# Patient Record
Sex: Female | Born: 1990 | Race: White | Hispanic: Yes | Marital: Married | Smoking: Never smoker
Health system: Southern US, Community
[De-identification: ages and names within clinical notes are randomized; demographics above are authoritative.]

---
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
## 2009-07-09 ENCOUNTER — Emergency Department (HOSPITAL_COMMUNITY): Admission: EM | Admit: 2009-07-09 | Discharge: 2009-07-10 | Payer: Self-pay | Admitting: Emergency Medicine

## 2010-08-10 LAB — URINALYSIS, ROUTINE W REFLEX MICROSCOPIC
Bilirubin Urine: NEGATIVE
Glucose, UA: NEGATIVE mg/dL
Hgb urine dipstick: NEGATIVE
Ketones, ur: 15 mg/dL — AB
Nitrite: NEGATIVE
Protein, ur: 30 mg/dL — AB
Specific Gravity, Urine: 1.031 — ABNORMAL HIGH (ref 1.005–1.030)
Urobilinogen, UA: 1 mg/dL (ref 0.0–1.0)
pH: 7.5 (ref 5.0–8.0)

## 2010-08-10 LAB — URINE MICROSCOPIC-ADD ON

## 2010-08-10 LAB — WET PREP, GENITAL: Yeast Wet Prep HPF POC: NONE SEEN

## 2010-08-10 LAB — POCT PREGNANCY, URINE: Preg Test, Ur: NEGATIVE

## 2010-08-10 LAB — GC/CHLAMYDIA PROBE AMP, GENITAL
Chlamydia, DNA Probe: NEGATIVE
GC Probe Amp, Genital: NEGATIVE

## 2010-08-29 LAB — CBC
HCT: 41.7 % (ref 36.0–46.0)
Hemoglobin: 14.2 g/dL (ref 12.0–15.0)
MCHC: 34 g/dL (ref 30.0–36.0)
MCV: 86.6 fL (ref 78.0–100.0)
Platelets: 245 10*3/uL (ref 150–400)
RBC: 4.81 MIL/uL (ref 3.87–5.11)
RDW: 13.1 % (ref 11.5–15.5)
WBC: 6.2 10*3/uL (ref 4.0–10.5)

## 2010-08-29 LAB — HCG, SERUM, QUALITATIVE: Preg, Serum: NEGATIVE

## 2010-10-04 NOTE — Op Note (Signed)
Laurie Reid, Laurie Reid              ACCOUNT NO.:  [id number]   MEDICAL RECORD NO.:  [id number]          PATIENT TYPE:  OIB   LOCATION:  5005                         FACILITY:  [facility]   PHYSICIAN:  Vanita Panda. Magnus Ivan, M.D.DATE OF BIRTH:  1990-11-11   DATE OF PROCEDURE:  10/20/2008  DATE OF DISCHARGE:                               OPERATIVE REPORT   PREOPERATIVE DIAGNOSIS:  Right knee displaced tibial eminence avulsion  fracture.   SURGEON:  Right knee displaced tibial eminence avulsion fracture.   PROCEDURE:  Right knee arthroscopy, debridement and attempted direct  primary repair of the displaced tibial eminence.   SURGEON:  Vanita Panda. Magnus Ivan, MD   ANESTHESIA:  General.   ANTIBIOTICS:  1 g IV Ancef.   BLOOD LOSS:  Minimal.   TOURNIQUET TIME:  2 hours 12 minutes.   INDICATIONS:  Briefly, [name] is a 20 year old who last Thursday  slipped and fell down some steps sustaining injury to her right knee.  She had a large effusion and an x-ray showed tibial eminence fracture  which was a large piece with significant displacement.  It was  recommended she undergo attempted repair of this piece of the idea that  she may need ACL reconstruction down the road.  The risks and benefits  of surgery were explained to her in length and she understood the need  to proceed with surgery.   DESCRIPTION OF PROCEDURE:  After informed consent was obtained, the  appropriate right leg was marked.  She was brought to the operating room  and placed supine on the operating table.  General anesthesia was then  obtained.  A nonsterile tourniquet was placed around her upper right  thigh and her leg was prepped and draped with DuraPrep and sterile  drapes including a sterile stockinette.  With the bed raised and lateral  leg post utilized, a time-out was called to identify as the correct  patient and correct right knee.  An Esmarch was used to wrap out the  knee and the tourniquet was  inflated to 250 mL pressure.  I then made an  anterolateral arthroscopy portal and an arthroscope was inserted.  There  was a large effusion encountered of the knee with a bloody effusion.  Right away, you could see that there was a large tibial eminence  fracture that affected mainly the lateral plateau, but also the medial  plateau in the whole eminence.  There was actually even partial tearing  proximally of the ACL off of its femoral attachment and there were only  mild remnants of the ACL intact.  I then made an anterior medial portal  and it was then used as an arthroscopic shaver to debride under the  bone.  The PCL was intact as well as the medial and lateral meniscus.  Next, I attempted to get the piece anatomically reduced which was quite  difficult because this was a very big piece.  Using the Arthrex tibial  guide for ACL repairs, I was able to make incision along the anterior  medial tibia at the level of tibial tubercle.  I then placed  2 guide  pins through the fracture piece and using #2 FiberWire suture to try to  pull the piece back down.  It did not completely reduced, so I tried to  pass 2 more pins and even make a separate stab incision to place a small  Acutrak type of screw to hold the piece down.  This broke it into some  smaller pieces and I was able to not do this in entirety and the piece  was not anatomically reduced.  I then allowed fluid lavage of the knee  and removed all instrumentation.  I closed the deep tissue on the tibia  wound with 0 Vicryl followed by 2-0 Vicryl on the subcutaneous tissue  and interrupted 3-0 nylon on skin.  All portal sites were also closed  with interrupted 3-0 nylon.  I then drained fluid from the knee and then  infiltrated a mixture of Marcaine and morphine into the knee.  I then  put on a plaster splint with the knee in a fully extended and even  hyperextended position.  The patient was then awakened, extubated, and  taken to the  recovery room in stable condition.  Postoperatively, I will  allow her to weight-bear as tolerated, but I am going to keep her knee  in a hyperextended position to allow this piece of bone to try the heal.  I think the worst case scenario would be an ACL reconstruction down the  road once the knee has healed.      Vanita Panda. Magnus Ivan, M.D.  Electronically Signed     CYB/MEDQ  D:  10/20/2008  T:  10/21/2008  Job:  [id number]

## 2010-10-04 NOTE — Discharge Summary (Signed)
NAMEBRAE, SCHAAFSMA              ACCOUNT NO.:  [id number]   MEDICAL RECORD NO.:  [id number]          PATIENT TYPE:  OIB   LOCATION:  5005                         FACILITY:  [facility]   PHYSICIAN:  Vanita Panda. Magnus Ivan, M.D.DATE OF BIRTH:  1991/05/20   DATE OF ADMISSION:  10/20/2008  DATE OF DISCHARGE:  10/22/2008                               DISCHARGE SUMMARY   ADMITTING DIAGNOSIS:  Right knee tibial eminence fracture.   DISCHARGE DIAGNOSIS:  Right knee tibial eminence fracture.   PROCEDURES:  Right knee arthroscopy with repair of right knee tibial  eminence fracture.   HOSPITAL COURSE:  Briefly, [name] is 20 year old who last week  fell down some stairs when she tripped carrying laundry.  She was seen  in the emergency room and found to have displaced tibial eminence  fracture in her right knee.  It was recommended she undergo arthroscopy  with reduction of this significantly displaced piece.  The risks and  benefits of this were explained to her mother, who stated she understood  the need to proceed with the surgery.   She was taken to the operating room on the day of admission where she  underwent a right knee arthroscopy with reduction of the displaced  piece.  She was then admitted to the Orthopedic floor bed and on  postoperative day #1, started to work with physical therapy in a hinged  knee brace with weightbearing as tolerated and her knee extended.  She  understood the need to keep her knee extended as well.  However, with  physical therapy, she did get lightheaded and was on considerable amount  of pain medicines and it was felt that she would benefit from additional  day of physical therapy before being discharged to home.  By the day of  discharge, she was tolerating oral diet as well as oral pain  medications, was much more comfortable, and was felt safe to discharge  home.   DISPOSITION:  Home.   DISCHARGE INSTRUCTIONS:  While she is at home, she will  continue to keep  her knee straight until further notice.   Followup in the office will be in 2 weeks.   DISCHARGE MEDICATIONS:  1. Enteric-coated aspirin daily.  2. Percocet as needed for pain.  3. Robaxin as needed for muscle relaxant.      Vanita Panda. Magnus Ivan, M.D.  Electronically Signed    CYB/MEDQ  D:  10/22/2008  T:  10/22/2008  Job:  [id number]

## 2011-10-05 ENCOUNTER — Encounter (HOSPITAL_COMMUNITY): Payer: Self-pay | Admitting: *Deleted

## 2011-10-05 ENCOUNTER — Emergency Department (HOSPITAL_COMMUNITY)
Admission: EM | Admit: 2011-10-05 | Discharge: 2011-10-05 | Disposition: A | Discharge status: Home / Self Care | Payer: Self-pay | Attending: Emergency Medicine | Admitting: Emergency Medicine

## 2011-10-05 DIAGNOSIS — R197 Diarrhea, unspecified: Secondary | ICD-10-CM | POA: Insufficient documentation

## 2011-10-05 DIAGNOSIS — K529 Noninfective gastroenteritis and colitis, unspecified: Secondary | ICD-10-CM

## 2011-10-05 DIAGNOSIS — R112 Nausea with vomiting, unspecified: Secondary | ICD-10-CM | POA: Insufficient documentation

## 2011-10-05 DIAGNOSIS — K5289 Other specified noninfective gastroenteritis and colitis: Secondary | ICD-10-CM | POA: Insufficient documentation

## 2011-10-05 DIAGNOSIS — R109 Unspecified abdominal pain: Secondary | ICD-10-CM | POA: Insufficient documentation

## 2011-10-05 LAB — COMPREHENSIVE METABOLIC PANEL
ALT: 16 U/L (ref 0–35)
AST: 17 U/L (ref 0–37)
Albumin: 4.8 g/dL (ref 3.5–5.2)
Alkaline Phosphatase: 57 U/L (ref 39–117)
BUN: 10 mg/dL (ref 6–23)
CO2: 27 mEq/L (ref 19–32)
Calcium: 9.9 mg/dL (ref 8.4–10.5)
Chloride: 101 mEq/L (ref 96–112)
Creatinine, Ser: 0.6 mg/dL (ref 0.50–1.10)
GFR calc Af Amer: >90 mL/min (ref >90)
GFR calc non Af Amer: >90 mL/min (ref >90)
Glucose, Bld: 96 mg/dL (ref 70–99)
Potassium: 4.1 mEq/L (ref 3.5–5.1)
Sodium: 139 mEq/L (ref 135–145)
Total Bilirubin: 0.6 mg/dL (ref 0.3–1.2)
Total Protein: 7.7 g/dL (ref 6.0–8.3)

## 2011-10-05 LAB — DIFFERENTIAL
Basophils Absolute: 0 10*3/uL (ref 0.0–0.1)
Basophils Relative: 0 % (ref 0–1)
Eosinophils Absolute: 0 10*3/uL (ref 0.0–0.7)
Eosinophils Relative: 0 % (ref 0–5)
Lymphocytes Relative: 8 % — ABNORMAL LOW (ref 12–46)
Lymphs Abs: 0.8 10*3/uL (ref 0.7–4.0)
Monocytes Absolute: 0.7 10*3/uL (ref 0.1–1.0)
Monocytes Relative: 7 % (ref 3–12)
Neutro Abs: 8.3 10*3/uL — ABNORMAL HIGH (ref 1.7–7.7)
Neutrophils Relative %: 85 % — ABNORMAL HIGH (ref 43–77)

## 2011-10-05 LAB — URINALYSIS, ROUTINE W REFLEX MICROSCOPIC
Glucose, UA: NEGATIVE mg/dL
Ketones, ur: >80 mg/dL — AB
Nitrite: NEGATIVE
Protein, ur: 30 mg/dL — AB
Specific Gravity, Urine: 1.026 (ref 1.005–1.030)
Urobilinogen, UA: 0.2 mg/dL (ref 0.0–1.0)
pH: 7 (ref 5.0–8.0)

## 2011-10-05 LAB — CBC
HCT: 40 % (ref 36.0–46.0)
Hemoglobin: 14.3 g/dL (ref 12.0–15.0)
MCH: 30.2 pg (ref 26.0–34.0)
MCHC: 35.8 g/dL (ref 30.0–36.0)
MCV: 84.4 fL (ref 78.0–100.0)
Platelets: 229 10*3/uL (ref 150–400)
RBC: 4.74 MIL/uL (ref 3.87–5.11)
RDW: 12.7 % (ref 11.5–15.5)
WBC: 9.8 10*3/uL (ref 4.0–10.5)

## 2011-10-05 LAB — URINE MICROSCOPIC-ADD ON

## 2011-10-05 LAB — PREGNANCY, URINE: Preg Test, Ur: NEGATIVE

## 2011-10-05 MED ORDER — MORPHINE SULFATE 4 MG/ML IJ SOLN
4.0000 mg | Freq: Once | INTRAMUSCULAR | Status: DC
  Filled 2011-10-05: qty 1

## 2011-10-05 MED ORDER — ONDANSETRON HCL 4 MG PO TABS: 4.0000 mg | ORAL_TABLET | Freq: Four times a day (QID) | ORAL | Status: AC

## 2011-10-05 MED ORDER — DIPHENOXYLATE-ATROPINE 2.5-0.025 MG PO TABS: 1.0000 | ORAL_TABLET | Freq: Four times a day (QID) | ORAL | Status: AC | PRN

## 2011-10-05 MED ORDER — ONDANSETRON 4 MG PO TBDP
8.0000 mg | ORAL_TABLET | Freq: Once | ORAL | Status: AC
  Administered 2011-10-05: 8 mg via ORAL
  Filled 2011-10-05: qty 2

## 2011-10-05 NOTE — ED Notes (Signed)
Patient is AOx4 and comfortable with her discharge instructions. 

## 2011-10-05 NOTE — Discharge Instructions (Signed)
Ms Laurie Reid we treated you in the ER tonight with IV fluids, zofran and morphine.  Follow up with your PCP if no improvement or return to the ER for high fever, and uncontrolled nausea and vomiting.  Take the Zofran for nausea and the Lomotil for diarrhea stools.  Start the diet called a Bratt diet as listed below.   B.R.A.T. Diet Your doctor has recommended the B.R.A.T. diet for you or your child until the condition improves. This is often used to help control diarrhea and vomiting symptoms. If you or your child can tolerate clear liquids, you may have:  Bananas.   Rice.   Applesauce.   Toast (and other simple starches such as crackers, potatoes, noodles).  Be sure to avoid dairy products, meats, and fatty foods until symptoms are better. Fruit juices such as apple, grape, and prune juice can make diarrhea worse. Avoid these. Continue this diet for 2 days or as instructed by your caregiver. Document Released: 05/08/2005 Document Revised: 04/27/2011 Document Reviewed: 10/25/2006 [facility] Patient Information 2012 [place], [place].   B.R.A.T. Diet Your doctor has recommended the B.R.A.T. diet for you or your child until the condition improves. This is often used to help control diarrhea and vomiting symptoms. If you or your child can tolerate clear liquids, you may have:  Bananas.   Rice.   Applesauce.   Toast (and other simple starches such as crackers, potatoes, noodles).  Be sure to avoid dairy products, meats, and fatty foods until symptoms are better. Fruit juices such as apple, grape, and prune juice can make diarrhea worse. Avoid these. Continue this diet for 2 days or as instructed by your caregiver. Document Released: 05/08/2005 Document Revised: 04/27/2011 Document Reviewed: 10/25/2006 [facility] Patient Information 2012 [place], [place].

## 2011-10-05 NOTE — ED Provider Notes (Signed)
Medical screening examination/treatment/procedure(s) were performed by non-physician practitioner and as supervising physician I was immediately available for consultation/collaboration.   Forbes Cellar, MD 10/05/11 [phone]

## 2011-10-05 NOTE — ED Provider Notes (Signed)
History     CSN: [id number]  Arrival date & time 10/05/11  0059   First MD Initiated Contact with Patient 10/05/11 0319      Chief Complaint  Patient presents with  . Abdominal Pain    (Consider location/radiation/quality/duration/timing/severity/associated sxs/prior treatment) Patient is a 21 y.o. female presenting with abdominal pain. The history is provided by the patient. No language interpreter was used.  Abdominal Pain The primary symptoms of the illness include abdominal pain, nausea, vomiting and diarrhea. The primary symptoms of the illness do not include fever, shortness of breath, dysuria, vaginal discharge or vaginal bleeding.  The patient states that she believes she is currently not pregnant. The patient has not had a change in bowel habit.  Nausea vomiting and diarrhea today since awakening.  States that her daughter had the same thing a couple weeks ago.  She had diarrhea x 7 and vomited x 2.  Took advil around 11pm with relief.    History reviewed. No pertinent past medical history.  History reviewed. No pertinent past surgical history.  No family history on file.  History  Substance Use Topics  . Smoking status: Never Smoker   . Smokeless tobacco: Not on file  . Alcohol Use: No    OB History    Grav Para Term Preterm Abortions TAB SAB Ect Mult Living                  Review of Systems  Constitutional: Negative for fever.  Respiratory: Negative for shortness of breath.   Gastrointestinal: Positive for nausea, vomiting, abdominal pain and diarrhea.  Genitourinary: Negative for dysuria, vaginal bleeding and vaginal discharge.    Allergies  Review of patient's allergies indicates no known allergies.  Home Medications   Current Outpatient Rx  Name Route Sig Dispense Refill  . PRESCRIPTION MEDICATION Oral Take 1-2 tablets by mouth See admin instructions. Patient took 2 tablets last Wednesday 09/27/11, 2 tablets today 10/04/2011, then 1 tablet every month  from here on out for skin condition.      BP 106/55  Pulse 79  Temp(Src) 99.5 F (37.5 C) (Oral)  Resp 18  Ht 5\' 5"  (1.651 m)  Wt 127 lb (57.607 kg)  BMI 21.13 kg/m2  SpO2 98%  LMP 10/05/2011  Physical Exam  Nursing note and vitals reviewed. Constitutional: She is oriented to person, place, and time. She appears well-developed and well-nourished.  HENT:  Head: Normocephalic and atraumatic.  Eyes: Conjunctivae and EOM are normal. Pupils are equal, round, and reactive to light.  Neck: Normal range of motion. Neck supple.  Cardiovascular: Normal rate.   Pulmonary/Chest: Effort normal.  Abdominal: Soft. Bowel sounds are normal. She exhibits no distension. There is no tenderness. There is no rebound and no guarding.  Musculoskeletal: Normal range of motion. She exhibits no edema and no tenderness.  Neurological: She is alert and oriented to person, place, and time. She has normal reflexes.  Skin: Skin is warm and dry.  Psychiatric: She has a normal mood and affect.    ED Course  Procedures (including critical care time)  Labs Reviewed  URINALYSIS, ROUTINE W REFLEX MICROSCOPIC - Abnormal; Notable for the following:    APPearance CLOUDY (*)    Hgb urine dipstick LARGE (*)    Bilirubin Urine SMALL (*)    Ketones, ur >80 (*)    Protein, ur 30 (*)    Leukocytes, UA TRACE (*)    All other components within normal limits  DIFFERENTIAL - Abnormal;  Notable for the following:    Neutrophils Relative 85 (*)    Neutro Abs 8.3 (*)    Lymphocytes Relative 8 (*)    All other components within normal limits  URINE MICROSCOPIC-ADD ON - Abnormal; Notable for the following:    Squamous Epithelial / LPF FEW (*)    All other components within normal limits  PREGNANCY, URINE  CBC  COMPREHENSIVE METABOLIC PANEL   No results found.   No diagnosis found.    MDM  No further vomiting or diarrhea in the ER.  Treated with IVF, zofran and morphine.  Rx for zofran and lomotil.   Gastroenteritis/ viral.   Labs Reviewed  URINALYSIS, ROUTINE W REFLEX MICROSCOPIC - Abnormal; Notable for the following:    APPearance CLOUDY (*)    Hgb urine dipstick LARGE (*)    Bilirubin Urine SMALL (*)    Ketones, ur >80 (*)    Protein, ur 30 (*)    Leukocytes, UA TRACE (*)    All other components within normal limits  DIFFERENTIAL - Abnormal; Notable for the following:    Neutrophils Relative 85 (*)    Neutro Abs 8.3 (*)    Lymphocytes Relative 8 (*)    All other components within normal limits  URINE MICROSCOPIC-ADD ON - Abnormal; Notable for the following:    Squamous Epithelial / LPF FEW (*)    All other components within normal limits  PREGNANCY, URINE  CBC  COMPREHENSIVE METABOLIC PANEL          Remi Haggard, NP 10/05/11 [phone]

## 2011-10-05 NOTE — ED Notes (Signed)
abd pain with nausea vomitng and diarrhea since this am. lmpnow

## 2012-01-19 ENCOUNTER — Emergency Department (HOSPITAL_COMMUNITY)
Admission: EM | Admit: 2012-01-19 | Discharge: 2012-01-19 | Disposition: A | Discharge status: Home / Self Care | Payer: Self-pay | Attending: Emergency Medicine | Admitting: Emergency Medicine

## 2012-01-19 ENCOUNTER — Encounter (HOSPITAL_COMMUNITY): Payer: Self-pay | Admitting: Family Medicine

## 2012-01-19 DIAGNOSIS — N760 Acute vaginitis: Secondary | ICD-10-CM | POA: Insufficient documentation

## 2012-01-19 DIAGNOSIS — A499 Bacterial infection, unspecified: Secondary | ICD-10-CM | POA: Insufficient documentation

## 2012-01-19 DIAGNOSIS — B9689 Other specified bacterial agents as the cause of diseases classified elsewhere: Secondary | ICD-10-CM | POA: Insufficient documentation

## 2012-01-19 DIAGNOSIS — B3731 Acute candidiasis of vulva and vagina: Secondary | ICD-10-CM | POA: Insufficient documentation

## 2012-01-19 DIAGNOSIS — B373 Candidiasis of vulva and vagina: Secondary | ICD-10-CM

## 2012-01-19 LAB — WET PREP, GENITAL: Trich, Wet Prep: NONE SEEN

## 2012-01-19 LAB — URINALYSIS, ROUTINE W REFLEX MICROSCOPIC
Bilirubin Urine: NEGATIVE
Glucose, UA: NEGATIVE mg/dL
Hgb urine dipstick: NEGATIVE
Ketones, ur: NEGATIVE mg/dL
Nitrite: NEGATIVE
Protein, ur: NEGATIVE mg/dL
Specific Gravity, Urine: 1.024 (ref 1.005–1.030)
Urobilinogen, UA: 1 mg/dL (ref 0.0–1.0)
pH: 7.5 (ref 5.0–8.0)

## 2012-01-19 LAB — URINE MICROSCOPIC-ADD ON

## 2012-01-19 LAB — POCT PREGNANCY, URINE: Preg Test, Ur: NEGATIVE

## 2012-01-19 MED ORDER — FLUCONAZOLE 100 MG PO TABS: 150.0000 mg | ORAL_TABLET | Freq: Once | ORAL | Status: DC

## 2012-01-19 MED ORDER — FLUCONAZOLE 150 MG PO TABS: 150.0000 mg | ORAL_TABLET | Freq: Once | ORAL | Status: DC

## 2012-01-19 MED ORDER — METRONIDAZOLE 500 MG PO TABS: 500.0000 mg | ORAL_TABLET | Freq: Two times a day (BID) | ORAL | Status: DC

## 2012-01-19 MED ORDER — FLUCONAZOLE 150 MG PO TABS: 150.0000 mg | ORAL_TABLET | Freq: Once | ORAL | Status: AC

## 2012-01-19 NOTE — ED Notes (Signed)
Pt recently started pcnvk, and now has vaginal irritation.

## 2012-01-19 NOTE — ED Provider Notes (Signed)
History     CSN: [id number]  Arrival date & time 01/19/12  0912   First MD Initiated Contact with Patient 01/19/12 [phone]      Chief Complaint  Patient presents with  . Vaginal Itching    HPI 21 yo female who presents with vaginal itching that started Tuesday. Reports small amount of white discharge. Started pen VK on Saturday for tooth infection. Doesn't report any new  yesterday. sts very small amount of discharge. Pt currently taking abx for tooth infection. Denies any abdominal pain, dysuria, polyuria. No fevers. Used vagisil which helped only temporarily. Reports noticing some mild swelling yesterday and took benadryl which made her sleepy.  Last intercourse: Tuesday. LMP: June. [facility] for [facility]D's last week which were negative.   History reviewed. No pertinent past medical history.  History reviewed. No pertinent past surgical history.  History reviewed. No pertinent family history.  History  Substance Use Topics  . Smoking status: Never Smoker   . Smokeless tobacco: Not on file  . Alcohol Use: No    Review of Systems  All other systems reviewed and are negative.    Allergies  Review of patient's allergies indicates no known allergies.  Home Medications   Current Outpatient Rx  Name Route Sig Dispense Refill  . PENICILLIN V POTASSIUM 500 MG PO TABS Oral Take 500-1,000 mg by mouth See admin instructions. Starting 01/13/12 for 6 days. Take 1000 mg now, then take 500 mg 4 times a day until finished.    Marland Kitchen FLUCONAZOLE 150 MG PO TABS Oral Take 1 tablet (150 mg total) by mouth once. 1 tablet 1  . METRONIDAZOLE 500 MG PO TABS Oral Take 1 tablet (500 mg total) by mouth 2 (two) times daily. 14 tablet 0    BP 104/71  Pulse 88  Temp 98.3 F (36.8 C) (Oral)  Resp 16  SpO2 100%  LMP 11/15/2011  Physical Exam  Constitutional: No distress.  HENT:  Head: Normocephalic.  Neck: Normal range of motion.  Cardiovascular: Normal rate, regular rhythm and  normal heart sounds.   Pulmonary/Chest: Effort normal and breath sounds normal. No respiratory distress. She has no wheezes.  Abdominal: Soft. Bowel sounds are normal. She exhibits no distension. There is no tenderness. There is no rebound and no guarding.  Genitourinary:          External labia erythema and irritation present. No ulcerated lesions.  Thick white vaginal discharge present  Normal appearing cervix. No cervical motion tenderness    ED Course  Procedures (including critical care time)  Labs Reviewed  WET PREP, GENITAL - Abnormal; Notable for the following:    Yeast Wet Prep HPF POC FEW (*)     Clue Cells Wet Prep HPF POC FEW (*)     WBC, Wet Prep HPF POC MANY (*)     All other components within normal limits  URINALYSIS, ROUTINE W REFLEX MICROSCOPIC - Abnormal; Notable for the following:    APPearance CLOUDY (*)     Leukocytes, UA LARGE (*)     All other components within normal limits  URINE MICROSCOPIC-ADD ON - Abnormal; Notable for the following:    Squamous Epithelial / LPF FEW (*)     All other components within normal limits  POCT PREGNANCY, URINE: negative   No results found.   1. Candidal vulvovaginitis   2. Bacterial vaginosis     MDM  Wet prep showing BV and yeast. Yeast infection likely from antibiotic  use. Patient discharged with Rx for flagyl and diflucan.         Lonia Skinner, MD 01/19/12 [phone]

## 2012-01-19 NOTE — ED Notes (Signed)
Pt complaining of vaginal itching that started yesterday. sts very small amount of discharge. Pt currently taking abx for tooth infection. Denies any abdominal pain

## 2012-01-20 NOTE — ED Provider Notes (Signed)
Medical screening examination/treatment/procedure(s) were performed by non-physician practitioner and as supervising physician I was immediately available for consultation/collaboration.  Derwood Kaplan, MD 01/20/12 1116

## 2012-01-23 ENCOUNTER — Encounter (HOSPITAL_COMMUNITY): Payer: Self-pay | Admitting: *Deleted

## 2012-01-23 ENCOUNTER — Emergency Department (HOSPITAL_COMMUNITY)
Admission: EM | Admit: 2012-01-23 | Discharge: 2012-01-23 | Discharge status: Against Medical Advice | Payer: Self-pay | Attending: Emergency Medicine | Admitting: Emergency Medicine

## 2012-01-23 DIAGNOSIS — R110 Nausea: Secondary | ICD-10-CM | POA: Insufficient documentation

## 2012-01-23 DIAGNOSIS — R42 Dizziness and giddiness: Secondary | ICD-10-CM | POA: Insufficient documentation

## 2012-01-23 LAB — URINALYSIS, ROUTINE W REFLEX MICROSCOPIC
Bilirubin Urine: NEGATIVE
Glucose, UA: NEGATIVE mg/dL
Hgb urine dipstick: NEGATIVE
Ketones, ur: NEGATIVE mg/dL
Leukocytes, UA: NEGATIVE
Nitrite: NEGATIVE
Protein, ur: NEGATIVE mg/dL
Specific Gravity, Urine: 1.015 (ref 1.005–1.030)
Urobilinogen, UA: 0.2 mg/dL (ref 0.0–1.0)
pH: 8 (ref 5.0–8.0)

## 2012-01-23 LAB — POCT PREGNANCY, URINE: Preg Test, Ur: NEGATIVE

## 2012-01-23 NOTE — ED Notes (Signed)
Pt called to re-assess.  No response. Will call in 10 minutes.

## 2012-01-23 NOTE — ED Notes (Signed)
Pt reports dizziness since Sunday, reports dizziness has worsened. Reports nausea. Reports headache/lightedness. gcs 15.

## 2012-01-24 ENCOUNTER — Encounter (HOSPITAL_COMMUNITY): Payer: Self-pay | Admitting: Physical Medicine and Rehabilitation

## 2012-01-24 ENCOUNTER — Emergency Department (HOSPITAL_COMMUNITY)
Admission: EM | Admit: 2012-01-24 | Discharge: 2012-01-24 | Disposition: A | Discharge status: Home / Self Care | Payer: Self-pay | Attending: Emergency Medicine | Admitting: Emergency Medicine

## 2012-01-24 DIAGNOSIS — R1110 Vomiting, unspecified: Secondary | ICD-10-CM | POA: Insufficient documentation

## 2012-01-24 DIAGNOSIS — R42 Dizziness and giddiness: Secondary | ICD-10-CM | POA: Insufficient documentation

## 2012-01-24 MED ORDER — MECLIZINE HCL 25 MG PO TABS: 25.0000 mg | ORAL_TABLET | Freq: Once | ORAL | Status: DC

## 2012-01-24 MED ORDER — MECLIZINE HCL 50 MG PO TABS: 25.0000 mg | ORAL_TABLET | Freq: Three times a day (TID) | ORAL | Status: AC | PRN

## 2012-01-24 MED ORDER — MECLIZINE HCL 25 MG PO TABS
25.0000 mg | ORAL_TABLET | Freq: Once | ORAL | Status: AC
  Administered 2012-01-24: 25 mg via ORAL
  Filled 2012-01-24: qty 1

## 2012-01-24 MED ORDER — SODIUM CHLORIDE 0.9 % IV BOLUS (SEPSIS)
1000.0000 mL | Freq: Once | INTRAVENOUS | Status: AC
  Administered 2012-01-24: 1000 mL via INTRAVENOUS

## 2012-01-24 NOTE — ED Notes (Signed)
Pt presents to department for evaluation of dizziness and N/V. Ongoing x2 days, was here yesterday, but left AMA. States no relief of symptoms. Denies pain. She is conscious alert and oriented x4. No neurological deficits noted. Ambulatory to triage.

## 2012-01-24 NOTE — ED Notes (Signed)
Discharged home with written and verbal instructions.  No questions or concerns at discharge. 

## 2012-01-24 NOTE — ED Notes (Addendum)
Patient states "dizziness with movement, nausea with movement"- No other complaints- Patient states "no period since June 22, but negative pregnancy tests".

## 2012-01-24 NOTE — ED Provider Notes (Signed)
History     CSN: [id number]  Arrival date & time 01/24/12  4098   First MD Initiated Contact with Patient 01/24/12 1121      Chief Complaint  Patient presents with  . Dizziness  . Emesis    (Consider location/radiation/quality/duration/timing/severity/associated sxs/prior treatment) HPI Comments: Laurie Reid 21 y.o. female   The chief complaint is: Patient presents with:   Dizziness   Emesis    21 y/o female presents to the ED today with cc dizziness with n/v. States that she has had several episodes in the past where she gets sudden onset dizziness, but this is the first episode where she has actually thrown up.  All previous episodes have resolved on their own at varied times.  She states that she has had about 2 days of dizziness which she describes as room spinning, light headed and nausea.  She began vomiting yesterday.  She came to be evaluated yesteday but was unable to stay because she had to pick up her child.  She denies racing or skipping heart. She denies symptoms of presyncope. She denies any recent history of URI or other viral illness.  She denies any blood loss or excessive sweating.   Patient is a 21 y.o. female presenting with vomiting. The history is provided by the patient and the spouse. No language interpreter was used.  Emesis  This is a recurrent problem. The current episode started 12 to 24 hours ago. The problem occurs continuously. The problem has not changed (the same since onset) since onset.There has been no fever. Pertinent negatives include no abdominal pain, no arthralgias, no chills, no cough, no diarrhea, no fever, no headaches, no myalgias, no sweats and no URI.    No past medical history on file.  No past surgical history on file.  No family history on file.  History  Substance Use Topics  . Smoking status: Never Smoker   . Smokeless tobacco: Not on file  . Alcohol Use: No    OB History    Grav Para Term Preterm Abortions TAB SAB  Ect Mult Living                  Review of Systems  Constitutional: Negative for fever, chills and fatigue.  HENT: Negative for ear pain and sinus pressure.   Respiratory: Negative for cough and shortness of breath.   Cardiovascular: Negative for chest pain and palpitations.  Gastrointestinal: Positive for nausea and vomiting. Negative for abdominal pain, diarrhea and blood in stool.  Genitourinary: Negative for dysuria.  Musculoskeletal: Negative for myalgias and arthralgias.  Neurological: Positive for dizziness. Negative for seizures, syncope, facial asymmetry, speech difficulty, numbness and headaches.    Allergies  Review of patient's allergies indicates no known allergies.  Home Medications   Current Outpatient Rx  Name Route Sig Dispense Refill  . ACETAMINOPHEN 500 MG PO TABS Oral Take 1,000 mg by mouth every 6 (six) hours as needed. For pain    . METRONIDAZOLE 500 MG PO TABS Oral Take 500 mg by mouth 2 (two) times daily. For 7 days; Start date 01/19/12    . MECLIZINE HCL 50 MG PO TABS Oral Take 0.5 tablets (25 mg total) by mouth 3 (three) times daily as needed. 30 tablet 0    BP 114/70  Pulse 84  Temp 98 F (36.7 C) (Oral)  Resp 18  SpO2 99%  LMP 11/15/2011  Physical Exam  Nursing note and vitals reviewed. Constitutional: She is oriented to person, place,  and time. She appears well-developed and well-nourished. No distress.  HENT:  Head: Normocephalic and atraumatic.  Eyes: Conjunctivae and EOM are normal. Pupils are equal, round, and reactive to light. No scleral icterus.       Dix Hallpike maneuver indeterminate for nystagmus.   Neck: Normal range of motion.  Cardiovascular: Normal rate, regular rhythm and normal heart sounds.  Exam reveals no gallop and no friction rub.   No murmur heard. Pulmonary/Chest: Effort normal and breath sounds normal. No respiratory distress.  Abdominal: Soft. Bowel sounds are normal. She exhibits no distension and no mass. There is  no tenderness. There is no guarding.  Musculoskeletal: Normal range of motion.  Neurological: She is alert and oriented to person, place, and time. She has normal reflexes. She displays normal reflexes. No cranial nerve deficit. Coordination (patient able to walk, but states that she feels dizzy.) normal.  Skin: Skin is warm and dry. She is not diaphoretic.    ED Course  Procedures (including critical care time)  Labs Reviewed - No data to display No results found.  Patient with several previous episodes of vertiginous sxs and nausea.  No focal neuro deficits. RF for stroke, intracranial etiology minimal and no HA on presentation.    Plan 1 liter bolus of fluid and antivert.   Patient feeling much relief after fluid bolus, but still mildly nauseated.  Awaiting administration of antivert. Will check back with patient after.   Patient states that sxs are resolved after fluid and meclizine.  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 I am going to have her f/u with ENT. All questions answered fully.      1. Vertigo       MDM  Discussed reasons to seek immediate care. Patient expresses understanding and agrees with plan.         Arthor Captain, PA-C 01/26/12 [phone]

## 2012-01-24 NOTE — ED Notes (Signed)
LMP in June

## 2012-01-26 NOTE — ED Provider Notes (Signed)
Medical screening examination/treatment/procedure(s) were performed by non-physician practitioner and as supervising physician I was immediately available for consultation/collaboration.   Carleene Cooper III, MD 01/26/12 1739

## 2013-05-28 ENCOUNTER — Emergency Department (HOSPITAL_COMMUNITY)
Admission: EM | Admit: 2013-05-28 | Discharge: 2013-05-28 | Disposition: A | Discharge status: Home / Self Care | Payer: Self-pay | Attending: Emergency Medicine | Admitting: Emergency Medicine

## 2013-05-28 ENCOUNTER — Encounter (HOSPITAL_COMMUNITY): Payer: Self-pay | Admitting: Emergency Medicine

## 2013-05-28 ENCOUNTER — Emergency Department (HOSPITAL_COMMUNITY): Payer: Self-pay

## 2013-05-28 DIAGNOSIS — J029 Acute pharyngitis, unspecified: Secondary | ICD-10-CM | POA: Insufficient documentation

## 2013-05-28 DIAGNOSIS — R059 Cough, unspecified: Secondary | ICD-10-CM | POA: Insufficient documentation

## 2013-05-28 DIAGNOSIS — R61 Generalized hyperhidrosis: Secondary | ICD-10-CM | POA: Insufficient documentation

## 2013-05-28 DIAGNOSIS — R05 Cough: Secondary | ICD-10-CM

## 2013-05-28 DIAGNOSIS — Z79899 Other long term (current) drug therapy: Secondary | ICD-10-CM | POA: Insufficient documentation

## 2013-05-28 DIAGNOSIS — Z791 Long term (current) use of non-steroidal anti-inflammatories (NSAID): Secondary | ICD-10-CM | POA: Insufficient documentation

## 2013-05-28 MED ORDER — GUAIFENESIN 100 MG/5ML PO LIQD: 100.0000 mg | ORAL | Status: DC | PRN

## 2013-05-28 MED ORDER — AZITHROMYCIN 250 MG PO TABS
250.0000 mg | ORAL_TABLET | Freq: Every day | ORAL | Status: DC
Start: 2013-05-28 — End: 2015-02-23

## 2013-05-28 NOTE — ED Notes (Addendum)
Pt c/o cold X 1 month, has been taking OTC meds. Yesterday she started having CP, worse today, painful to cough. C/o sore throat and productive cough with green mucus. C/o night sweats, hasn't checked her temperature at home. Having nasal drainage and congestion. Reports after coughing a lot she starts to get a HA. Denies n/v/d.  Pt in nad, skin warm and dry, resp e/u.

## 2013-05-28 NOTE — ED Provider Notes (Signed)
CSN: [id number]     Arrival date & time 05/28/13  9562 History   First MD Initiated Contact with Patient 05/28/13 [phone]     Chief Complaint  Patient presents with  . Cough  . Night Sweats  . Sore Throat   (Consider location/radiation/quality/duration/timing/severity/associated sxs/prior Treatment) HPI Comments: Patient presents to the emergency department with chief complaint of cough, sore throat, running nose, and sinus congestion. She states that her symptoms have been ongoing for the past month. She is tried taking NyQuil, DayQuil, and Mucinex with relief, but states that her symptoms persists. She denies fevers or chills. She states sometimes the coughing will cause her to become dizzy or lightheaded, and this makes her nauseated, but she denies any vomiting, diarrhea, or constipation. Denies any dysuria, or vaginal discharge.  The history is provided by the patient. No language interpreter was used.    History reviewed. No pertinent past medical history. Past Surgical History  Procedure Laterality Date  . Knee surgery Right 2009   No family history on file. History  Substance Use Topics  . Smoking status: Never Smoker   . Smokeless tobacco: Not on file  . Alcohol Use: No   OB History   Grav Para Term Preterm Abortions TAB SAB Ect Mult Living                 Review of Systems  All other systems reviewed and are negative.    Allergies  Review of patient's allergies indicates no known allergies.  Home Medications   Current Outpatient Rx  Name  Route  Sig  Dispense  Refill  . dextromethorphan-guaiFENesin (MUCINEX DM) 30-600 MG per 12 hr tablet   Oral   Take 1 tablet by mouth 2 (two) times daily.         . naproxen sodium (ANAPROX) 220 MG tablet   Oral   Take 440 mg by mouth 2 (two) times daily with a meal.         . Pseudoephedrine-APAP-DM (DAYQUIL PO)   Oral   Take 1 tablet by mouth daily as needed (congestion).          BP 118/73  Pulse 73  Temp(Src)  97.6 F (36.4 C) (Oral)  Resp 18  SpO2 100%  LMP 05/05/2013 Physical Exam  Nursing note and vitals reviewed. Constitutional: She is oriented to person, place, and time. She appears well-developed and well-nourished.  HENT:  Head: Normocephalic and atraumatic.  Oropharynx is mildly erythematous, no tonsillar exudates, uvula is midline, airway is intact  Eyes: Conjunctivae and EOM are normal. Pupils are equal, round, and reactive to light.  Neck: Normal range of motion. Neck supple.  Cardiovascular: Normal rate and regular rhythm.  Exam reveals no gallop and no friction rub.   No murmur heard. Pulmonary/Chest: Effort normal and breath sounds normal. No respiratory distress. She has no wheezes. She has no rales. She exhibits no tenderness.  Abdominal: Soft. Bowel sounds are normal. She exhibits no distension and no mass. There is no tenderness. There is no rebound and no guarding.  Musculoskeletal: Normal range of motion. She exhibits no edema and no tenderness.  Neurological: She is alert and oriented to person, place, and time.  Skin: Skin is warm and dry.  Psychiatric: She has a normal mood and affect. Her behavior is normal. Judgment and thought content normal.    ED Course  Procedures (including critical care time) Results for orders placed during the hospital encounter of 01/23/12  URINALYSIS, ROUTINE W  REFLEX MICROSCOPIC      Result Value Range   Color, Urine YELLOW  YELLOW   APPearance CLOUDY (*) CLEAR   Specific Gravity, Urine 1.015  1.005 - 1.030   pH 8.0  5.0 - 8.0   Glucose, UA NEGATIVE  NEGATIVE mg/dL   Hgb urine dipstick NEGATIVE  NEGATIVE   Bilirubin Urine NEGATIVE  NEGATIVE   Ketones, ur NEGATIVE  NEGATIVE mg/dL   Protein, ur NEGATIVE  NEGATIVE mg/dL   Urobilinogen, UA 0.2  0.0 - 1.0 mg/dL   Nitrite NEGATIVE  NEGATIVE   Leukocytes, UA NEGATIVE  NEGATIVE  POCT PREGNANCY, URINE      Result Value Range   Preg Test, Ur NEGATIVE  NEGATIVE   Dg Chest 2  View  05/28/2013   CLINICAL DATA:  Cough for 1 month.  EXAM: CHEST  2 VIEW  COMPARISON:  None.  FINDINGS: Heart size and mediastinal contours are within normal limits. Both lungs are clear. Visualized skeletal structures are unremarkable.  IMPRESSION: Negative exam.   Electronically Signed   By: Drusilla Kannerhomas  Dalessio M.D.   On: 05/28/2013 10:18     EKG Interpretation   None       MDM   1. Cough    Patient with cough and cold x 1 month.  No fever.  No distress.  No n/v.  Looks well.  I will give her a z-pak and cough medicine.  Follow-up with PCP.  Patient is stable and ready for discharge.    Roxy Horsemanobert Luciano Cinquemani, PA-C 05/28/13 1025

## 2013-05-28 NOTE — Discharge Instructions (Signed)

## 2013-05-28 NOTE — ED Notes (Signed)
Pt returned from radiology.

## 2013-05-30 NOTE — ED Provider Notes (Signed)
Medical screening examination/treatment/procedure(s) were performed by non-physician practitioner and as supervising physician I was immediately available for consultation/collaboration.    Candyce ChurnJohn David Donat Humble, MD 05/30/13 [phone]

## 2013-12-19 ENCOUNTER — Emergency Department (HOSPITAL_COMMUNITY)
Admission: EM | Admit: 2013-12-19 | Discharge: 2013-12-19 | Discharge status: Against Medical Advice | Payer: Self-pay | Attending: Emergency Medicine | Admitting: Emergency Medicine

## 2013-12-19 ENCOUNTER — Encounter (HOSPITAL_COMMUNITY): Payer: Self-pay | Admitting: Emergency Medicine

## 2013-12-19 DIAGNOSIS — R197 Diarrhea, unspecified: Secondary | ICD-10-CM | POA: Insufficient documentation

## 2013-12-19 DIAGNOSIS — R112 Nausea with vomiting, unspecified: Secondary | ICD-10-CM | POA: Insufficient documentation

## 2013-12-19 LAB — URINALYSIS, ROUTINE W REFLEX MICROSCOPIC
Glucose, UA: NEGATIVE mg/dL
Hgb urine dipstick: NEGATIVE
Ketones, ur: >80 mg/dL — AB
Leukocytes, UA: NEGATIVE
Nitrite: NEGATIVE
Protein, ur: 30 mg/dL — AB
Specific Gravity, Urine: 1.031 — ABNORMAL HIGH (ref 1.005–1.030)
Urobilinogen, UA: 0.2 mg/dL (ref 0.0–1.0)
pH: 5.5 (ref 5.0–8.0)

## 2013-12-19 LAB — URINE MICROSCOPIC-ADD ON

## 2013-12-19 LAB — POC URINE PREG, ED: Preg Test, Ur: NEGATIVE

## 2013-12-19 MED ORDER — ONDANSETRON 4 MG PO TBDP
8.0000 mg | ORAL_TABLET | Freq: Once | ORAL | Status: AC
  Administered 2013-12-19: 8 mg via ORAL
  Filled 2013-12-19: qty 2

## 2013-12-19 NOTE — ED Notes (Signed)
Pt stating she has to leave to pick up her daughter, does not wish to have labs obtained at this time, encouraged to stay or return as needed

## 2013-12-19 NOTE — ED Notes (Signed)
Pt presents with persistent nausea, vomiting, diarrhea, "motion sickness," and unable to eat or drink x3 days. Pt reports abd pain only with diarrhea, feeling hot and dizziness.

## 2014-11-05 ENCOUNTER — Emergency Department (HOSPITAL_COMMUNITY)
Admission: EM | Admit: 2014-11-05 | Discharge: 2014-11-05 | Disposition: A | Discharge status: Home / Self Care | Payer: Self-pay | Attending: Emergency Medicine | Admitting: Emergency Medicine

## 2014-11-05 ENCOUNTER — Encounter (HOSPITAL_COMMUNITY): Payer: Self-pay | Admitting: *Deleted

## 2014-11-05 DIAGNOSIS — J069 Acute upper respiratory infection, unspecified: Secondary | ICD-10-CM | POA: Insufficient documentation

## 2014-11-05 DIAGNOSIS — Z792 Long term (current) use of antibiotics: Secondary | ICD-10-CM | POA: Insufficient documentation

## 2014-11-05 DIAGNOSIS — Z791 Long term (current) use of non-steroidal anti-inflammatories (NSAID): Secondary | ICD-10-CM | POA: Insufficient documentation

## 2014-11-05 DIAGNOSIS — B309 Viral conjunctivitis, unspecified: Secondary | ICD-10-CM | POA: Insufficient documentation

## 2014-11-05 DIAGNOSIS — Z79899 Other long term (current) drug therapy: Secondary | ICD-10-CM | POA: Insufficient documentation

## 2014-11-05 DIAGNOSIS — H5713 Ocular pain, bilateral: Secondary | ICD-10-CM

## 2014-11-05 MED ORDER — FLUORESCEIN SODIUM 1 MG OP STRP
1.0000 | ORAL_STRIP | Freq: Once | OPHTHALMIC | Status: AC
  Administered 2014-11-05: 1 via OPHTHALMIC
  Filled 2014-11-05: qty 1

## 2014-11-05 MED ORDER — OFLOXACIN 0.3 % OP SOLN
1.0000 [drp] | Freq: Four times a day (QID) | OPHTHALMIC | Status: DC
  Administered 2014-11-05: 1 [drp] via OPHTHALMIC
  Filled 2014-11-05: qty 5

## 2014-11-05 MED ORDER — TETRACAINE HCL 0.5 % OP SOLN
2.0000 [drp] | Freq: Once | OPHTHALMIC | Status: AC
  Administered 2014-11-05: 2 [drp] via OPHTHALMIC
  Filled 2014-11-05: qty 2

## 2014-11-05 MED ORDER — SCOPOLAMINE HBR 0.25 % OP SOLN: 1.0000 [drp] | Freq: Once | OPHTHALMIC | Status: DC

## 2014-11-05 NOTE — ED Notes (Signed)
Pt got sick Saturday and lost voice on Saturday. Nose congestion, throat pain, and eyes red and irritated.

## 2014-11-05 NOTE — ED Provider Notes (Signed)
CSN: [id number]     Arrival date & time 11/05/14  1410 History   First MD Initiated Contact with Patient 11/05/14 1447     No chief complaint on file.    (Consider location/radiation/quality/duration/timing/severity/associated sxs/prior Treatment) HPI   [name] is a 24 year old female otherwise healthy who presents to the emergency department today with 5 days of upper respiratory infection symptoms including runny nose, sore throat, minor cough, but with complications of extremely painful red eyes bilaterally.  After her cold began last Saturday, she went to bed with her contacts in at night, like she recurrently does, but woke up with her eyes crusted shut, and throughout the day had to wipe away yellow-green mucus from her eyes. She tried wear contacts again for another day, but had increasing watery discharge from her eyes with associated itching and burning with diffuse redness.  On Monday (4 days ago)she stopped wearing her contacts, and threw them away, and continued to experience irritation, and found no relief with frequent Visine treatment.  She continued to have cold symptoms without any complication for the next few days, however today she attempted to put in a new pair of contacts, and had severe pain in both of her eyes, worse in the right, with increasing watery discharge, any new photophobia. The pain is rated 10 out of 10, it is unbearable, so she came to the emergency department today to be evaluated.  She has not had any headache, nausea, vomiting, chest pain, shortness of breath, abdominal pain.  She reports some subjective fevers with chills.  History reviewed. No pertinent past medical history. Past Surgical History  Procedure Laterality Date  . Knee surgery Right 2009   No family history on file. History  Substance Use Topics  . Smoking status: Never Smoker   . Smokeless tobacco: Not on file  . Alcohol Use: No   OB History    No data available     Review  of Systems  Ten systems are reviewed and are negative for acute change except as noted in the HPI  Allergies  Review of patient's allergies indicates no known allergies.  Home Medications   Prior to Admission medications   Medication Sig Start Date End Date Taking? Authorizing Provider  azithromycin (ZITHROMAX Z-PAK) 250 MG tablet Take 1 tablet (250 mg total) by mouth daily. 500mg  PO day 1, then 250mg  PO days 205 05/28/13   Roxy Horseman, PA-C  dextromethorphan-guaiFENesin [facility] DM) 30-600 MG per 12 hr tablet Take 1 tablet by mouth 2 (two) times daily.    Historical Provider, MD  guaiFENesin (ROBITUSSIN) 100 MG/5ML liquid Take 5-10 mLs (100-200 mg total) by mouth every 4 (four) hours as needed for cough. 05/28/13   Roxy Horseman, PA-C  naproxen sodium (ANAPROX) 220 MG tablet Take 440 mg by mouth 2 (two) times daily with a meal.    Historical Provider, MD  Pseudoephedrine-APAP-DM (DAYQUIL PO) Take 1 tablet by mouth daily as needed (congestion).    Historical Provider, MD   BP 104/73 mmHg  Pulse 80  Temp(Src) 98 F (36.7 C) (Oral)  Resp 18  Ht 5\' 6"  (1.676 m)  SpO2 99%  LMP 10/27/2014 Physical Exam  Constitutional: She is oriented to person, place, and time. She appears well-developed and well-nourished. She appears distressed.  HENT:  Head: Normocephalic and atraumatic. Head is without right periorbital erythema and without left periorbital erythema.  Right Ear: Tympanic membrane, external ear and ear canal normal.  Left Ear: Tympanic membrane, external  ear and ear canal normal.  Nose: Mucosal edema and rhinorrhea present. No nose lacerations, sinus tenderness or nasal deformity. Right sinus exhibits no maxillary sinus tenderness and no frontal sinus tenderness. Left sinus exhibits no maxillary sinus tenderness and no frontal sinus tenderness.  Mouth/Throat: Uvula is midline and mucous membranes are normal. Mucous membranes are not pale, not dry and not cyanotic. Posterior  oropharyngeal erythema present. No oropharyngeal exudate, posterior oropharyngeal edema or tonsillar abscesses.  Eyes: EOM are normal. Pupils are equal, round, and reactive to light. Right eye exhibits chemosis and discharge. Right eye exhibits no exudate and no hordeolum. No foreign body present in the right eye. Left eye exhibits chemosis and discharge. Left eye exhibits no exudate and no hordeolum. No foreign body present in the left eye. Right conjunctiva is injected. Right conjunctiva has no hemorrhage. Left conjunctiva is injected. Left conjunctiva has no hemorrhage. No scleral icterus.  Slit lamp exam:      The right eye shows fluorescein uptake. The right eye shows no anterior chamber bulge.       The left eye shows no fluorescein uptake and no anterior chamber bulge.  Superior and inferior lids inverted, revealed beefy red palpebra conjunctiva Injected conjunctiva, no ciliary injection, anterior chamber non-hazy  Neck: Normal range of motion.  Cardiovascular: Normal rate, regular rhythm and normal heart sounds.  Exam reveals no gallop and no friction rub.   No murmur heard. Pulmonary/Chest: Effort normal and breath sounds normal. No respiratory distress. She has no wheezes. She has no rales. She exhibits no tenderness.  Musculoskeletal: Normal range of motion. She exhibits no edema.  Neurological: She is alert and oriented to person, place, and time.  Skin: Skin is warm and dry. No rash noted. She is not diaphoretic. No erythema. No pallor.  Nursing note and vitals reviewed.    ED Course  Procedures (including critical care time) Labs Review Labs Reviewed - No data to display  Imaging Review No results found.   EKG Interpretation None      MDM   Final diagnoses:  None   Painful red itchy eyes with URI sx and contact wearing history.  Not able to assess visual acuity due to severely impaired baseline vision, although attempted by RN.  Symptoms most consistent with upper  respiratory infection and viral conjunctivitis, however with contact lens, want to rule out corneal ulcer, abrasion or herpetic keratitis.  Plan to do a fluorescein eye exam to evaluate.  Patient given an ice pack to place of her eyes for symptomatic relief, which did help her pain and irritation.  Medications  tetracaine (PONTOCAINE) 0.5 % ophthalmic solution 2 drop (2 drops Both Eyes Given 11/05/14 1528)  fluorescein ophthalmic strip 1 strip (1 strip Both Eyes Given 11/05/14 1528)   See PE for documentation of exam findings, one slight area of uptake in the right eye superior and lateral to her iris.  No corneal ulcer visualized. Patient had a lot of pain even with numbing drops to eyes.  Will prescribe ofloxacin drops to cover for any possible pseudomonas infection. Also prescribed eyedrops for pain. Patient was given a ophthalmology referral to follow up with, and return precautions were given and patient verbalized understanding. Her boyfriend was present in the room probably this and was giving her right home. She was given a work note allowing her to return to work after 24 hours of anabiotic drop use, however I did explain to the patient that I believed this was a viral  conjunctivitis, which is highly contagious, and to observe frequent handwashing.  Filed Vitals:   11/05/14 1440 11/05/14 1612  BP: 104/73 99/60  Pulse: 80 70  Temp: 98 F (36.7 C) 98 F (36.7 C)  TempSrc: Oral Oral  Resp: 18 16  Height:  (1.676 m)   SpO2: 99% 100%         Laurie Berry, PA-C 11/05/14 2351  Vanetta Mulders, MD 11/06/14 [phone]

## 2014-11-05 NOTE — Discharge Instructions (Signed)
Conjunctivitis °Conjunctivitis is commonly called "pink eye." Conjunctivitis can be caused by bacterial or viral infection, allergies, or injuries. There is usually redness of the lining of the eye, itching, discomfort, and sometimes discharge. There may be deposits of matter along the eyelids. A viral infection usually causes a watery discharge, while a bacterial infection causes a yellowish, thick discharge. Pink eye is very contagious and spreads by direct contact. °You may be given antibiotic eyedrops as part of your treatment. Before using your eye medicine, remove all drainage from the eye by washing gently with warm water and cotton balls. Continue to use the medication until you have awakened 2 mornings in a row without discharge from the eye. Do not rub your eye. This increases the irritation and helps spread infection. Use separate towels from other household members. Wash your hands with soap and water before and after touching your eyes. Use cold compresses to reduce pain and sunglasses to relieve irritation from light. Do not wear contact lenses or wear eye makeup until the infection is gone. °SEEK MEDICAL CARE IF:  °· Your symptoms are not better after 3 days of treatment. °· You have increased pain or trouble seeing. °· The outer eyelids become very red or swollen. °Document Released: 06/15/2004 Document Revised: 07/31/2011 Document Reviewed: 05/08/2005 °[employer]® Patient Information ©2015 [employer]. This information is not intended to replace advice given to you by your health care provider. Make sure you discuss any questions you have with your health care provider. ° °Eye Drops °Use eye drops as directed. It may be easier to have someone help you put the drops in your eye. If you are alone, use the following instructions to help you. °· Wash your hands before putting drops in your eyes. °· Read the label and look at your medication. Check for any expiration date that may appear on the bottle or  tube. Changes of color may be a warning that the medication is old or ineffective. This is especially true if the medication has become brown in color. If you have questions or concerns, call your caregiver. °DROPS °· Tilt your head back with the affected eye uppermost. Gently pull down on your lower lid. Do not pull up on the upper lid. °· Look up. Place the dropper or bottle just over the edge of the lower lid near the white portion at the bottom of the eye. The goal is to have the drop go into the little sac formed by the lower lid and the bottom of the eye itself. Do not release the drop from a height of several inches over the eye. That will only serve to startle the person receiving the medicine when it lands and forces a blink. °· Steady your hand in a comfortable manner. An example would be to hold the dropper or bottle between your thumb and index (pointing) finger. Lean your index finger against the brow. °· Then, slowly and gently squeeze one drop of medication into your eye. °· Once the medication has been applied, place your finger between the lower eyelid and the nose, pressing firmly against the nose for 5-10 seconds. This will slow the process of the eye drop entering the small canal that normally drains tears into the nose, and therefore increases the exposure of the medicine to the eye for a few extra seconds. °OINTMENTS °· Look up. Place the tip of the tube just over the edge of the lower lid near the white portion at the bottom of the   eye. The goal is to create a line of ointment along the inner surface of the eyelid in the little sac formed by the lower lid and the bottom of the eye itself.  Avoid touching the tube tip to your eyeball or eyelid. This avoids contamination of the tube or the medicine in the tube.  Once a line of medicine has been created, hold the upper lid up and look down before releasing the upper lid. This will force the ointment to spread over the surface of the  eye.  Your vision will be very blurry for a few minutes after applying an ointment properly. This is normal and will clear as you continue to blink. For this reason, it is best to apply ointments just before going to sleep, or at a time when you can rest your eyes for 5-10 minutes after applying the medication. GENERAL  Store your medicine in a cool, dry place after each use.  If you need a second medication, wait at least two minutes. This helps the first medication to be taken up (absorbed) by the eye.  If you have been instructed to use both an eye drop and an eye ointment, always apply the drop first and then the ointment 3-4 minutes afterward. Never put medications into the eye unless the label reads, "For Ophthalmic Use," "For Use In Eyes" or "Eye Drops." If you have questions, call your caregiver. Document Released: 08/14/2000 Document Revised: 09/22/2013 Document Reviewed: 10/20/2008 [facility] Patient Information 2015 [place], [place]. This information is not intended to replace advice given to you by your health care provider. Make sure you discuss any questions you have with your health care provider.  Viral Conjunctivitis Conjunctivitis is an irritation (inflammation) of the clear membrane that covers the white part of the eye (the conjunctiva). The irritation can also happen on the underside of the eyelids. Conjunctivitis makes the eye red or pink in color. This is what is commonly known as pink eye. Viral conjunctivitis can spread easily (contagious). CAUSES   Infection from virus on the surface of the eye.  Infection from the irritation or injury of nearby tissues such as the eyelids or cornea.  More serious inflammation or infection on the inside of the eye.  Other eye diseases.  The use of certain eye medications. SYMPTOMS  The normally white color of the eye or the underside of the eyelid is usually pink or red in color. The pink eye is usually associated with irritation,  tearing and some sensitivity to light. Viral conjunctivitis is often associated with a clear, watery discharge. If a discharge is present, there may also be some blurred vision in the affected eye. DIAGNOSIS  Conjunctivitis is diagnosed by an eye exam. The eye specialist looks for changes in the surface tissues of the eye which take on changes characteristic of the specific types of conjunctivitis. A sample of any discharge may be collected on a Q-Tip (sterile swap). The sample will be sent to a lab to see whether or not the inflammation is caused by bacterial or viral infection. TREATMENT  Viral conjunctivitis will not respond to medicines that kill germs (antibiotics). Treatment is aimed at stopping a bacterial infection on top of the viral infection. The goal of treatment is to relieve symptoms (such as itching) with antihistamine drops or other eye medications.  HOME CARE INSTRUCTIONS   To ease discomfort, apply a cool, clean wash cloth to your eye for 10 to 20 minutes, 3 to 4 times a day.  Gently wipe away any drainage from the eye with a warm, wet washcloth or a cotton ball.  Wash your hands often with soap and use paper towels to dry.  Do not share towels or washcloths. This may spread the infection.  Change or wash your pillowcase every day.  You should not use eye make-up until the infection is gone.  Stop using contacts lenses. Ask your eye professional how to sterilize or replace them before using again. This depends on the type of contact lenses used.  Do not touch the edge of the eyelid with the eye drop bottle or ointment tube when applying medications to the affected eye. This will stop you from spreading the infection to the other eye or to others. SEEK IMMEDIATE MEDICAL CARE IF:   The infection has not improved within 3 days of beginning treatment.  A watery discharge from the eye develops.  Pain in the eye increases.  The redness is spreading.  Vision becomes  blurred.  An oral temperature above 102 F (38.9 C) develops, or as your caregiver suggests.  Facial pain, redness or swelling develops.  Any problems that may be related to the prescribed medicine develop. MAKE SURE YOU:   Understand these instructions.  Will watch your condition.  Will get help right away if you are not doing well or get worse. Document Released: 05/08/2005 Document Revised: 07/31/2011 Document Reviewed: 12/26/2007 [facility] Patient Information 2015 [place], [place]. This information is not intended to replace advice given to you by your health care provider. Make sure you discuss any questions you have with your health care provider.

## 2015-02-22 ENCOUNTER — Emergency Department (HOSPITAL_COMMUNITY)
Admission: EM | Admit: 2015-02-22 | Discharge: 2015-02-22 | Disposition: A | Discharge status: Home / Self Care | Payer: Self-pay | Attending: Emergency Medicine | Admitting: Emergency Medicine

## 2015-02-22 ENCOUNTER — Encounter (HOSPITAL_COMMUNITY): Payer: Self-pay | Admitting: *Deleted

## 2015-02-22 DIAGNOSIS — Z79899 Other long term (current) drug therapy: Secondary | ICD-10-CM | POA: Insufficient documentation

## 2015-02-22 DIAGNOSIS — R21 Rash and other nonspecific skin eruption: Secondary | ICD-10-CM | POA: Insufficient documentation

## 2015-02-22 MED ORDER — HYDROCORTISONE 2.5 % EX CREA: TOPICAL_CREAM | Freq: Two times a day (BID) | CUTANEOUS | Status: DC | PRN

## 2015-02-22 NOTE — ED Provider Notes (Signed)
CSN: [id number]     Arrival date & time 02/22/15  1230 History  By signing my name below, I, Laurie Reid, attest that this documentation has been prepared under the direction and in the presence of Laurie Houchen Camprubi-Soms, PA-C. Electronically Signed: [name], ED Scribe. 02/22/2015. 1:21 PM.  Chief Complaint  Patient presents with  . Rash   Patient is a 24 y.o. female presenting with rash. The history is provided by the patient. No language interpreter was used.  Rash Location:  Shoulder/arm and leg Shoulder/arm rash location:  L arm Leg rash location:  L knee Quality: itchiness and redness   Quality: not blistering, not draining, not painful and not weeping   Severity:  Mild Onset quality:  Gradual Duration:  5 days Timing:  Constant Progression:  Unchanged Chronicity:  New Context: not animal contact, not chemical exposure, not exposure to similar rash, not medications, not plant contact and not sick contacts   Relieved by:  Anti-itch cream (calamine) Worsened by:  Nothing tried Ineffective treatments:  None tried Associated symptoms: no abdominal pain, no diarrhea, no fever, no joint pain, no myalgias, no nausea, no shortness of breath, no sore throat and not vomiting    PCP: Pcp Not In System HPI Comments: Laurie Reid is a 24 y.o. female, with PMHx noted below and who has a new tattoo placed on her L wrist approximately 7 days ago, who presents to the Emergency Department complaining of spreading, itchy rash to L forearm and L knee onset approximately 5 days ago; pt reports applying Vick's rub, calamine  lotion, and rubbing alcohol with minimal relief; nothing worsens the Sx. Pt denies any new exposure including new meds, new pets, new plants, exposure to anyone else with similar rash, or affected individuals at home. Pt denies blistering of the rash, fevers, chills, chest pain, SOB, n/v/d/c, abd pain, dysuria, hematuria, numbness/tingling and weakness. No drainage or weeping  from the rash.   History reviewed. No pertinent past medical history. Past Surgical History  Procedure Laterality Date  . Knee surgery Right 2009   History reviewed. No pertinent family history. Social History  Substance Use Topics  . Smoking status: Never Smoker   . Smokeless tobacco: None  . Alcohol Use: No   OB History    No data available     Review of Systems  Constitutional: Negative for fever and chills.  HENT: Negative for sore throat.   Respiratory: Negative for shortness of breath.   Cardiovascular: Negative for chest pain.  Gastrointestinal: Negative for nausea, vomiting, abdominal pain, diarrhea and constipation.  Genitourinary: Negative for dysuria and hematuria.  Musculoskeletal: Negative for myalgias and arthralgias.  Skin: Positive for color change and rash.  Allergic/Immunologic: Negative for immunocompromised state.  Neurological: Negative for weakness and numbness.  Psychiatric/Behavioral: Negative for confusion.    10 Systems reviewed and all are negative for acute change except as noted in the HPI.   Allergies  Review of patient's allergies indicates no known allergies.  Home Medications   Prior to Admission medications   Medication Sig Start Date End Date Taking? Authorizing Provider  azithromycin (ZITHROMAX Z-PAK) 250 MG tablet Take 1 tablet (250 mg total) by mouth daily.  PO day 1, then  PO days 205 05/28/13   Roxy Horseman, PA-C  dextromethorphan-guaiFENesin [facility] DM) 30-600 MG per 12 hr tablet Take 1 tablet by mouth 2 (two) times daily.    Historical Provider, MD  guaiFENesin (ROBITUSSIN) 100 MG/5ML liquid Take 5-10 mLs (100-200 mg total)  by mouth every 4 (four) hours as needed for cough. 05/28/13   Roxy Horseman, PA-C  hydrocortisone 2.5 % cream Apply topically 2 (two) times daily as needed (itching). 02/22/15   Nurah Petrides Camprubi-Soms, PA-C  naproxen sodium (ANAPROX) 220 MG tablet Take 440 mg by mouth 2 (two) times daily with a  meal.    Historical Provider, MD  Pseudoephedrine-APAP-DM (DAYQUIL PO) Take 1 tablet by mouth daily as needed (congestion).    Historical Provider, MD  scopolamine (HYOSCINE) 0.25 % ophthalmic solution Place 1 drop into both eyes once. 1-2 drops in both eyes BID as needed for severe pain 11/05/14   Danelle Berry, PA-C   Triage Vitals: BP 110/61 mmHg  Pulse 80  Temp(Src) 98 F (36.7 C) (Oral)  Resp 16  SpO2 96%  LMP 02/08/2015 Physical Exam  Constitutional: She is oriented to person, place, and time. Vital signs are normal. She appears well-developed and well-nourished.  Non-toxic appearance. No distress.  Afebrile, nontoxic, NAD  HENT:  Head: Normocephalic and atraumatic.  Mouth/Throat: Mucous membranes are normal.  Eyes: Conjunctivae and EOM are normal. Right eye exhibits no discharge. Left eye exhibits no discharge.  Neck: Normal range of motion. Neck supple.  Cardiovascular: Normal rate.   Pulmonary/Chest: Effort normal. No respiratory distress.  Abdominal: Normal appearance. She exhibits no distension.  Musculoskeletal: Normal range of motion.  Neurological: She is alert and oriented to person, place, and time. She has normal strength. No sensory deficit.  Skin: Skin is warm, dry and intact. Rash noted. Rash is papular.  Erythematous, papular rash to left forearm and left anterior knee, no warmth or fluctuance, no evidence of cellulitis, no drainage, no interdigital web space involvement. No vesicles. SEE PICTURE BELOW  Psychiatric: She has a normal mood and affect. Her behavior is normal.  Nursing note and vitals reviewed.    ED Course  Procedures (including critical care time) DIAGNOSTIC STUDIES: Oxygen Saturation is 96% on RA, nl by my interpretation.    COORDINATION OF CARE: 1:14 PM: Discussed treatment plan with pt at bedside; patient verbalizes understanding and agrees with treatment plan.  MDM   Final diagnoses:  Rash    24 y.o. female here with rash to forearm  and knee x5 days. Recent tattoo to wrist, but no evidence of infection to this area. Appears to be small insect bites, no evidence of scabies or yeast. Unclear etiology but will treat with steroid cream. Discussed OTC antihistamines. F/up with [facility] in 1wk to recheck and to establish care. I explained the diagnosis and have given explicit precautions to return to the ER including for any other new or worsening symptoms. The patient understands and accepts the medical plan as it's been dictated and I have answered their questions. Discharge instructions concerning home care and prescriptions have been given. The patient is STABLE and is discharged to home in good condition.   I personally performed the services described in this documentation, which was scribed in my presence. The recorded information has been reviewed and is accurate.  BP 110/61 mmHg  Pulse 80  Temp(Src) 98 F (36.7 C) (Oral)  Resp 16  SpO2 96%  LMP 02/08/2015  Meds ordered this encounter  Medications  . hydrocortisone 2.5 % cream    Sig: Apply topically 2 (two) times daily as needed (itching).    Dispense:  15 g    Refill:  0    Order Specific Question:  Supervising Provider    Answer:  Eber Hong [[id number]]  [address] [place], PA-C 02/22/15 1325  Linwood Dibbles, MD 02/26/15 0021

## 2015-02-22 NOTE — Discharge Instructions (Signed)
Take hydrocortisone cream as prescribed. Continue your usual home medications. Get plenty of rest and drink plenty of fluids. Avoid any known triggers. Follow up with  and wellness for recheck and to establish care in 1 week. Return to the ER for changes or worsening symptoms.   Rash A rash is a change in the color or texture of your skin. There are many different types of rashes. You may have other problems that accompany your rash. CAUSES   Infections.  Allergic reactions. This can include allergies to pets or foods.  Certain medicines.  Exposure to certain chemicals, soaps, or cosmetics.  Heat.  Exposure to poisonous plants.  Tumors, both cancerous and noncancerous. SYMPTOMS   Redness.  Scaly skin.  Itchy skin.  Dry or cracked skin.  Bumps.  Blisters.  Pain. DIAGNOSIS  Your caregiver may do a physical exam to determine what type of rash you have. A skin sample (biopsy) may be taken and examined under a microscope. TREATMENT  Treatment depends on the type of rash you have. Your caregiver may prescribe certain medicines. For serious conditions, you may need to see a skin doctor (dermatologist). HOME CARE INSTRUCTIONS   Avoid the substance that caused your rash.  Do not scratch your rash. This can cause infection.  You may take cool baths to help stop itching.  Only take over-the-counter or prescription medicines as directed by your caregiver.  Keep all follow-up appointments as directed by your caregiver. SEEK IMMEDIATE MEDICAL CARE IF:  You have increasing pain, swelling, or redness.  You have a fever.  You have new or severe symptoms.  You have body aches, diarrhea, or vomiting.  Your rash is not better after 3 days. MAKE SURE YOU:  Understand these instructions.  Will watch your condition.  Will get help right away if you are not doing well or get worse. Document Released: 04/28/2002 Document Revised: 07/31/2011 Document Reviewed:  02/20/2011 [facility] Patient Information 2015 [place], [place]. This information is not intended to replace advice given to you by your health care provider. Make sure you discuss any questions you have with your health care provider.

## 2015-02-22 NOTE — ED Notes (Signed)
Declined W/C at D/C and was escorted to lobby by RN. 

## 2015-02-23 ENCOUNTER — Encounter (HOSPITAL_COMMUNITY): Payer: Self-pay | Admitting: *Deleted

## 2015-02-23 ENCOUNTER — Emergency Department (HOSPITAL_COMMUNITY)
Admission: EM | Admit: 2015-02-23 | Discharge: 2015-02-23 | Disposition: A | Discharge status: Home / Self Care | Payer: Self-pay | Attending: Emergency Medicine | Admitting: Emergency Medicine

## 2015-02-23 DIAGNOSIS — N760 Acute vaginitis: Secondary | ICD-10-CM | POA: Insufficient documentation

## 2015-02-23 DIAGNOSIS — R21 Rash and other nonspecific skin eruption: Secondary | ICD-10-CM

## 2015-02-23 DIAGNOSIS — B9689 Other specified bacterial agents as the cause of diseases classified elsewhere: Secondary | ICD-10-CM

## 2015-02-23 DIAGNOSIS — Z791 Long term (current) use of non-steroidal anti-inflammatories (NSAID): Secondary | ICD-10-CM | POA: Insufficient documentation

## 2015-02-23 DIAGNOSIS — Z79899 Other long term (current) drug therapy: Secondary | ICD-10-CM | POA: Insufficient documentation

## 2015-02-23 LAB — WET PREP, GENITAL
Trich, Wet Prep: NONE SEEN
Yeast Wet Prep HPF POC: NONE SEEN

## 2015-02-23 LAB — URINALYSIS, ROUTINE W REFLEX MICROSCOPIC
Bilirubin Urine: NEGATIVE
Glucose, UA: NEGATIVE mg/dL
Hgb urine dipstick: NEGATIVE
Ketones, ur: 15 mg/dL — AB
Leukocytes, UA: NEGATIVE
Nitrite: NEGATIVE
Protein, ur: NEGATIVE mg/dL
Specific Gravity, Urine: 1.021 (ref 1.005–1.030)
Urobilinogen, UA: 1 mg/dL (ref 0.0–1.0)
pH: 7.5 (ref 5.0–8.0)

## 2015-02-23 MED ORDER — METRONIDAZOLE 500 MG PO TABS: 500.0000 mg | ORAL_TABLET | Freq: Two times a day (BID) | ORAL | Status: DC

## 2015-02-23 MED ORDER — PERMETHRIN 5 % EX CREA: TOPICAL_CREAM | CUTANEOUS | Status: DC

## 2015-02-23 NOTE — ED Provider Notes (Signed)
CSN: [id number]     Arrival date & time 02/23/15  1230 History   First MD Initiated Contact with Patient 02/23/15 1324     Chief Complaint  Patient presents with  . Rash     (Consider location/radiation/quality/duration/timing/severity/associated sxs/prior Treatment) HPI Comments: Patient presents with complaint of rash and vaginal itching. Patient was seen yesterday in the emergency department for itchy rash on her left arm and left knee. She was prescribed topical steroids which have improved the itching. Today she returns with the rash spreading, now to her right arm. She denies other family members at home having similar rash. She also complains of vaginal itching without rash, discharge, bleeding. No fevers, nausea or vomiting. No diarrhea or constipation, blood in stool. No dysuria, hematuria. Patient is sexually active. No other treatment prior to arrival. No new medications or skin exposures. No other signs of anaphylaxis.  The history is provided by the patient and medical records.    History reviewed. No pertinent past medical history. Past Surgical History  Procedure Laterality Date  . Knee surgery Right 2009   History reviewed. No pertinent family history. Social History  Substance Use Topics  . Smoking status: Never Smoker   . Smokeless tobacco: None  . Alcohol Use: No   OB History    No data available     Review of Systems  Constitutional: Negative for fever.  HENT: Negative for facial swelling and trouble swallowing.   Eyes: Negative for redness.  Respiratory: Negative for shortness of breath, wheezing and stridor.   Cardiovascular: Negative for chest pain.  Gastrointestinal: Negative for nausea and vomiting.  Genitourinary: Negative for dysuria, frequency, vaginal bleeding, vaginal discharge and menstrual problem.  Musculoskeletal: Negative for myalgias.  Skin: Positive for rash.  Neurological: Negative for light-headedness.  Psychiatric/Behavioral: Negative  for confusion.      Allergies  Review of patient's allergies indicates no known allergies.  Home Medications   Prior to Admission medications   Medication Sig Start Date End Date Taking? Authorizing Provider  azithromycin (ZITHROMAX Z-PAK) 250 MG tablet Take 1 tablet (250 mg total) by mouth daily.  PO day 1, then  PO days 205 Patient not taking: Reported on 02/23/2015 05/28/13   Roxy Horseman, PA-C  dextromethorphan-guaiFENesin [facility] DM) 30-600 MG per 12 hr tablet Take 1 tablet by mouth 2 (two) times daily.    Historical Provider, MD  guaiFENesin (ROBITUSSIN) 100 MG/5ML liquid Take 5-10 mLs (100-200 mg total) by mouth every 4 (four) hours as needed for cough. Patient not taking: Reported on 02/23/2015 05/28/13   Roxy Horseman, PA-C  hydrocortisone 2.5 % cream Apply topically 2 (two) times daily as needed (itching). Patient not taking: Reported on 02/23/2015 02/22/15   Mercedes Camprubi-Soms, PA-C  naproxen sodium (ANAPROX) 220 MG tablet Take 440 mg by mouth 2 (two) times daily with a meal.    Historical Provider, MD  Pseudoephedrine-APAP-DM (DAYQUIL PO) Take 1 tablet by mouth daily as needed (congestion).    Historical Provider, MD  scopolamine (HYOSCINE) 0.25 % ophthalmic solution Place 1 drop into both eyes once. 1-2 drops in both eyes BID as needed for severe pain Patient not taking: Reported on 02/23/2015 11/05/14   Danelle Berry, PA-C   BP 101/61 mmHg  Pulse 56  Temp(Src) 98.4 F (36.9 C) (Oral)  Resp 16  SpO2 95%  LMP 02/08/2015   Physical Exam  Constitutional: She appears well-developed and well-nourished.  HENT:  Head: Normocephalic and atraumatic.  No oropharyngeal lesions. No lip swelling, tongue  swelling, uvula swelling.  Eyes: Conjunctivae are normal. Right eye exhibits no discharge. Left eye exhibits no discharge.  Neck: Normal range of motion. Neck supple.  Cardiovascular: Normal rate, regular rhythm and normal heart sounds.   Pulmonary/Chest: Effort normal  and breath sounds normal.  Abdominal: Soft. There is no tenderness.  Genitourinary: There is no rash or tenderness on the right labia. There is no rash or tenderness on the left labia. Uterus is not tender. Cervix exhibits no motion tenderness, no discharge and no friability. Right adnexum displays no mass and no tenderness. Left adnexum displays no mass and no tenderness. No erythema, tenderness or bleeding in the vagina. No signs of injury around the vagina. Vaginal discharge (white) found.  Neurological: She is alert.  Skin: Skin is warm and dry.  Patient with several punctate, papular areas noted around left forearm, right forearm, left knee. No involvement of web spaces of fingers or around her waist.  Psychiatric: She has a normal mood and affect.  Nursing note and vitals reviewed.   ED Course  Procedures (including critical care time) Labs Review Labs Reviewed  WET PREP, GENITAL - Abnormal; Notable for the following:    Clue Cells Wet Prep HPF POC FEW (*)    WBC, Wet Prep HPF POC MODERATE (*)    All other components within normal limits  URINALYSIS, ROUTINE W REFLEX MICROSCOPIC (NOT AT [facility]) - Abnormal; Notable for the following:    Ketones, ur 15 (*)    All other components within normal limits  GC/CHLAMYDIA PROBE AMP ([place]) NOT AT [facility]    Imaging Review No results found. I have personally reviewed and evaluated these images and lab results as part of my medical decision-making.   EKG Interpretation None       1:42 PM Patient seen and examined. Work-up initiated.   Vital signs reviewed and are as follows: BP 101/61 mmHg  Pulse 56  Temp(Src) 98.4 F (36.9 C) (Oral)  Resp 16  SpO2 95%  LMP 02/08/2015  Pelvic exam performed by H[place]cock PA-S2 with chaperone under my supervision.   Pt informed of results. Will treat for bacterial vaginosis.  Given rash appearance, will treat for scabies infestation. Encouraged to use Benadryl as needed for itching. Patient  can continue local administration of topical steroids. Patient encouraged to follow-up with PCP. Return in emergency department with any airway involvement, acute worsening of rash, fever, or other concerns. Patient verbalizes understanding and agrees with plan.  MDM   Final diagnoses:  Rash  Bacterial vaginosis   Rash: Does not appear to be related to any foods or medications. She does not have any signs of anaphylaxis. Given appearance, will treat for scabies, although rash is in an unusual distribution for this. Patient otherwise appears well, nontoxic.  Vaginal itching: No rash of genitalia. Exam reveals no concerning symptoms for PID however patient does have white discharge which is adhering to the walls of the vagina. Given few clue cells, will treat for bacterial vaginosis.    Renne Crigler, PA-C 02/26/15 1553  Laurence Spates, MD 02/26/15 [phone]

## 2015-02-23 NOTE — ED Notes (Signed)
Patient reports being seen here yesterday for rash to arms and legs, today states that she feels discomfort and itching to vaginal area. Airway intact.

## 2015-02-23 NOTE — ED Notes (Signed)
Pelvic cart set up at bedside  

## 2015-02-23 NOTE — Discharge Instructions (Signed)
Please read and follow all provided instructions.  Your diagnoses today include:  1. Rash   2. Bacterial vaginosis     Tests performed today include:  Wet prep - shows bacterial vaginosis  Urine test - no urine infection  Vital signs. See below for your results today.   Medications prescribed:   Permethrin cream - Apply to entire body before bed and wash off in morning, repeat in one week if not resolved.   Metronidazole - antibiotic  You have been prescribed an antibiotic medicine: take the entire course of medicine even if you are feeling better. Stopping early can cause the antibiotic not to work. Do not drink alcohol when taking this medication.   Take any prescribed medications only as directed.  Home care instructions:  Follow any educational materials contained in this packet.  BE VERY CAREFUL not to take multiple medicines containing Tylenol (also called acetaminophen). Doing so can lead to an overdose which can damage your liver and cause liver failure and possibly death.   Follow-up instructions: Please follow-up with your primary care provider in the next 3 days for further evaluation of your symptoms.   Return instructions:   Please return to the Emergency Department if you experience worsening symptoms.   Please return if you have any other emergent concerns.  Additional Information:  Your vital signs today were: BP 99/56 mmHg   Pulse 96   Temp(Src) 98.4 F (36.9 C) (Oral)   Resp 16   SpO2 98%   LMP 02/08/2015 If your blood pressure (BP) was elevated above 135/85 this visit, please have this repeated by your doctor within one month. --------------

## 2015-02-24 LAB — GC/CHLAMYDIA PROBE AMP (~~LOC~~) NOT AT ARMC
Chlamydia: NEGATIVE
Neisseria Gonorrhea: NEGATIVE

## 2016-06-03 ENCOUNTER — Encounter (HOSPITAL_COMMUNITY): Payer: Self-pay | Admitting: Emergency Medicine

## 2016-06-03 ENCOUNTER — Emergency Department (HOSPITAL_COMMUNITY)
Admission: EM | Admit: 2016-06-03 | Discharge: 2016-06-03 | Disposition: A | Discharge status: Home / Self Care | Payer: 59 | Attending: Emergency Medicine | Admitting: Emergency Medicine

## 2016-06-03 DIAGNOSIS — J069 Acute upper respiratory infection, unspecified: Secondary | ICD-10-CM | POA: Diagnosis not present

## 2016-06-03 DIAGNOSIS — Z79899 Other long term (current) drug therapy: Secondary | ICD-10-CM | POA: Diagnosis not present

## 2016-06-03 DIAGNOSIS — R0981 Nasal congestion: Secondary | ICD-10-CM | POA: Diagnosis present

## 2016-06-03 MED ORDER — AZITHROMYCIN 250 MG PO TABS: 250.0000 mg | ORAL_TABLET | Freq: Every day | ORAL | 0 refills | Status: DC

## 2016-06-03 NOTE — ED Provider Notes (Signed)
[facility]PT Provider Note   CSN: [id number] Arrival date & time: 06/03/16  1213  By signing my name below, I, Rosario AdieWilliam Andrew Hiatt, attest that this documentation has been prepared under the direction and in the presence of Terance HartKelly Dara Beidleman, PA-C.  Electronically Signed: Rosario AdieWilliam Andrew Hiatt, ED Scribe. 06/03/16. 1:27 PM.  History   Chief Complaint Chief Complaint  Patient presents with  . Nasal Congestion  . Fever   The history is provided by the patient. No language interpreter was used.    HPI Comments: Laurie Reid is a 26 y.o. female with no pertinent PMHx, who presents to the Emergency Department complaining of gradual onset, waxing and waning fever (Tmax 99) onset approximately two weeks ago. She reports associated persistent, productive cough w/ green-yellow sputum, chills, generalized myalgias/arthralgias, nasal congestion, sensation of ear congestion, rhinorrhea, sore throat, occiput headache, shortness of breath, and intermittent diarrhea secondary to her fever. She additionally states that she has sore-like chest pain secondary to coughing only. Pt notes that initially her symptoms began with dysphonia; however, this has since resolved. She has been taking Dayquil, Nyquil, Robitussin, and Mucinex, all without relief of her symptoms. Her significant other has recently been sick with simislar symptoms. Pt did not receive her yearly influenza vaccination. She denies ear pain, wheezing, abdominal pain, nausea, vomiting, trouble swallowing, or any other associated symptoms.   History reviewed. No pertinent past medical history.  There are no active problems to display for this patient.  Past Surgical History:  Procedure Laterality Date  . KNEE SURGERY Right 2009   OB History    No data available     Home Medications    Prior to Admission medications   Medication Sig Start Date End Date Taking? Authorizing Provider  metroNIDAZOLE (FLAGYL) 500 MG tablet Take 1 tablet (500  mg total) by mouth 2 (two) times daily. 02/23/15   Renne CriglerJoshua Geiple, PA-C  naproxen sodium (ANAPROX) 220 MG tablet Take 440 mg by mouth 2 (two) times daily with a meal.    Historical Provider, MD  permethrin (ELIMITE) 5 % cream Apply to body once before bed, leave on overnight (at least 8 hours), and wash off in morning. Repeat in one week if not improved. 02/23/15   Renne CriglerJoshua Geiple, PA-C  Pseudoephedrine-APAP-DM (DAYQUIL PO) Take 1 tablet by mouth daily as needed (congestion).    Historical Provider, MD   Family History No family history on file.  Social History Social History  Substance Use Topics  . Smoking status: Never Smoker  . Smokeless tobacco: Never Used  . Alcohol use No   Allergies   Patient has no known allergies.  Review of Systems Review of Systems  Constitutional: Positive for chills and fever.  HENT: Positive for congestion, rhinorrhea, sore throat and voice change. Negative for ear pain and trouble swallowing.   Respiratory: Positive for cough and shortness of breath. Negative for wheezing.   Cardiovascular: Positive for chest pain (2/2 cough only).  Gastrointestinal: Positive for diarrhea. Negative for abdominal pain, nausea and vomiting.  Musculoskeletal: Positive for arthralgias (generalized) and myalgias (generalized).  Neurological: Positive for headaches.   Physical Exam Updated Vital Signs BP 114/69 (BP Location: Left Arm)   Pulse 82   Temp 98.9 F (37.2 C) (Oral)   Resp 18   LMP 05/03/2016   SpO2 99%   Physical Exam  Constitutional: She appears well-developed and well-nourished. No distress.  HENT:  Head: Normocephalic and atraumatic.  Right Ear: External ear normal.  Left Ear: External  ear normal.  Nose: Nose normal.  Mouth/Throat: Uvula is midline and mucous membranes are normal. No trismus in the jaw. No uvula swelling. Posterior oropharyngeal erythema (mild) present. No oropharyngeal exudate, posterior oropharyngeal edema or tonsillar abscesses. No  tonsillar exudate.  Eyes: Conjunctivae are normal.  Neck: Normal range of motion.  Cardiovascular: Normal rate, regular rhythm and normal heart sounds.   No murmur heard. Pulmonary/Chest: Effort normal and breath sounds normal. No respiratory distress. She has no wheezes. She has no rales.  Abdominal: Soft. She exhibits no distension. There is no tenderness. There is no rebound and no guarding.  Musculoskeletal: Normal range of motion.  Lymphadenopathy:    She has cervical adenopathy.  Neurological: She is alert.  Skin: No pallor.  Psychiatric: She has a normal mood and affect. Her behavior is normal.  Nursing note and vitals reviewed.  ED Treatments / Results  DIAGNOSTIC STUDIES: Oxygen Saturation is 99% on RA, normal by my interpretation.   COORDINATION OF CARE: 1:26 PM-Discussed next steps with pt. Pt verbalized understanding and is agreeable with the plan.   Radiology No results found.  Procedures Procedures   Medications Ordered in ED Medications - No data to display  Initial Impression / Assessment and Plan / ED Course  I have reviewed the triage vital signs and the nursing notes.  Pertinent labs & imaging results that were available during my care of the patient were reviewed by me and considered in my medical decision making (see chart for details).  Clinical Course    26 year old female with URI symptoms for 2 weeks. Patient is afebrile, not tachycardic or tachypneic, normotensive, and not hypoxic. Will treat with Z-pack due to length of symptoms. Patient is NAD, non-toxic, with stable VS. Patient is informed of clinical course, understands medical decision making process, and agrees with plan. Opportunity for questions provided and all questions answered. Return precautions given.   Final Clinical Impressions(s) / ED Diagnoses   Final diagnoses:  Upper respiratory tract infection, unspecified type   New Prescriptions New Prescriptions   No medications on  file   I personally performed the services described in this documentation, which was scribed in my presence. The recorded information has been reviewed and is accurate.     Bethel Born, PA-C 06/06/16 Windell Moment    Gerhard Munch, MD 06/06/16 2129

## 2016-06-03 NOTE — Discharge Instructions (Signed)
Take Ibuprofen 600mg  for pain/fever Drink plenty of fluids and rest

## 2016-06-03 NOTE — ED Notes (Signed)
Declined W/C at D/C and was escorted to lobby by RN. 

## 2016-06-03 NOTE — ED Notes (Signed)
Video Visit Coupon # given to PT.

## 2016-06-03 NOTE — ED Triage Notes (Signed)
Pt. Stated, I've had a cold and cough with off and on fever for the last 2 weeks. I've taken everything possible OTC. And no help

## 2016-06-11 ENCOUNTER — Encounter (HOSPITAL_COMMUNITY): Payer: Self-pay | Admitting: Emergency Medicine

## 2016-06-11 ENCOUNTER — Emergency Department (HOSPITAL_COMMUNITY)
Admission: EM | Admit: 2016-06-11 | Discharge: 2016-06-11 | Disposition: A | Discharge status: Home / Self Care | Payer: 59 | Attending: Emergency Medicine | Admitting: Emergency Medicine

## 2016-06-11 DIAGNOSIS — H6692 Otitis media, unspecified, left ear: Secondary | ICD-10-CM | POA: Insufficient documentation

## 2016-06-11 DIAGNOSIS — H6690 Otitis media, unspecified, unspecified ear: Secondary | ICD-10-CM

## 2016-06-11 DIAGNOSIS — H9202 Otalgia, left ear: Secondary | ICD-10-CM | POA: Diagnosis present

## 2016-06-11 DIAGNOSIS — Z79899 Other long term (current) drug therapy: Secondary | ICD-10-CM | POA: Diagnosis not present

## 2016-06-11 MED ORDER — AMOXICILLIN-POT CLAVULANATE 875-125 MG PO TABS: 1.0000 | ORAL_TABLET | Freq: Two times a day (BID) | ORAL | 0 refills | Status: DC

## 2016-06-11 MED ORDER — AMOXICILLIN-POT CLAVULANATE 875-125 MG PO TABS
1.0000 | ORAL_TABLET | Freq: Once | ORAL | Status: AC
  Administered 2016-06-11: 1 via ORAL
  Filled 2016-06-11: qty 1

## 2016-06-11 NOTE — ED Triage Notes (Signed)
C/o L ear pain that has gotten worse over the past hour.  States she was seen in ED last week for URI.

## 2016-06-11 NOTE — ED Provider Notes (Signed)
[facility] Provider Note   CSN: [id number] Arrival date & time: 06/11/16  2058   By signing my name below, I, Laurie Reid, attest that this documentation has been prepared under the direction and in the presence of Laurie Horsemanobert Corneshia Hines, PA-C. Electronically Signed: Clarisse GougeXavier Reid, Scribe. 06/11/16. 9:41 PM.   History   Chief Complaint Chief Complaint  Patient presents with  . Otalgia   The history is provided by the patient and medical records. No language interpreter was used.    HPI Comments: Laurie Reid is a 26 y.o. female who presents to the Emergency Department complaining of left ear pain that has become severe in the past hour. She [facility] for URI symptoms and prescribed an antibiotic. Prior note states pt was prescribed a z-pack. She states the antibiotic has provided inadequate relief to her prior symptoms. NKDA.  History reviewed. No pertinent past medical history.  There are no active problems to display for this patient.   Past Surgical History:  Procedure Laterality Date  . KNEE SURGERY Right 2009    OB History    No data available       Home Medications    Prior to Admission medications   Medication Sig Start Date End Date Taking? Authorizing Provider  amoxicillin-clavulanate (AUGMENTIN) 875-125 MG tablet Take 1 tablet by mouth every 12 (twelve) hours. 06/11/16   Laurie Horsemanobert Baudelia Schroepfer, PA-C  azithromycin (ZITHROMAX) 250 MG tablet Take 1 tablet (250 mg total) by mouth daily. Take first 2 tablets together, then 1 every day until finished. 06/03/16   Bethel BornKelly Marie Gekas, PA-C  metroNIDAZOLE (FLAGYL) 500 MG tablet Take 1 tablet (500 mg total) by mouth 2 (two) times daily. 02/23/15   Renne CriglerJoshua Geiple, PA-C  naproxen sodium (ANAPROX) 220 MG tablet Take 440 mg by mouth 2 (two) times daily with a meal.    Historical Provider, MD  permethrin (ELIMITE) 5 % cream Apply to body once before bed, leave on overnight (at least 8 hours), and  wash off in morning. Repeat in one week if not improved. 02/23/15   Renne CriglerJoshua Geiple, PA-C  Pseudoephedrine-APAP-DM (DAYQUIL PO) Take 1 tablet by mouth daily as needed (congestion).    Historical Provider, MD    Family History No family history on file.  Social History Social History  Substance Use Topics  . Smoking status: Never Smoker  . Smokeless tobacco: Never Used  . Alcohol use No     Allergies   Patient has no known allergies.   Review of Systems Review of Systems  Constitutional: Positive for fatigue. Negative for chills and fever.  HENT: Positive for congestion, ear pain and rhinorrhea.   Eyes: Positive for redness.  Respiratory: Positive for cough.   Gastrointestinal: Positive for diarrhea.  Musculoskeletal: Positive for myalgias.  Neurological: Positive for headaches.     Physical Exam Updated Vital Signs BP 111/66 (BP Location: Left Arm)   Pulse 88   Temp 98.3 F (36.8 C) (Oral)   Resp 16   Ht 5\' 6"  (1.676 m)   Wt 147 lb (66.7 kg)   LMP 05/03/2016   SpO2 98%   BMI 23.73 kg/m   Physical Exam Physical Exam  Constitutional: Pt  is oriented to person, place, and time. Appears well-developed and well-nourished. No distress.  HENT:  Head: Normocephalic and atraumatic.  Right Ear: Tympanic membrane, external ear and ear canal normal.  Left Ear: TM is erythematous, congested, consistent with OM Nose: Mucosal edema and  moderate rhinorrhea present. No epistaxis. Right sinus exhibits no maxillary sinus tenderness and no frontal sinus tenderness. Left sinus exhibits no maxillary sinus tenderness and no frontal sinus tenderness.  Mouth/Throat: Uvula is midline and mucous membranes are normal. Mucous membranes are not pale and not cyanotic. No oropharyngeal exudate, posterior oropharyngeal edema, posterior oropharyngeal erythema or tonsillar abscesses.  Eyes: Conjunctivae are normal. Pupils are equal, round, and reactive to light.  Neck: Normal range of motion and  full passive range of motion without pain.  Cardiovascular: Normal rate and intact distal pulses.   Pulmonary/Chest: Effort normal and breath sounds normal. No stridor.  Clear and equal breath sounds without focal wheezes, rhonchi, rales  Abdominal: Soft. Bowel sounds are normal. There is no tenderness.  Musculoskeletal: Normal range of motion.  Lymphadenopathy:    Pthas no cervical adenopathy.  Neurological: Pt is alert and oriented to person, place, and time.  Skin: Skin is warm and dry. No rash noted. Pt is not diaphoretic.  Psychiatric: Normal mood and affect.  Nursing note and vitals reviewed.    ED Treatments / Results  DIAGNOSTIC STUDIES: Oxygen Saturation is 98% on RA, normal by my interpretation.    COORDINATION OF CARE: 9:41 PM Discussed treatment plan with pt at bedside and pt agreed to plan.  Labs (all labs ordered are listed, but only abnormal results are displayed) Labs Reviewed - No data to display  EKG  EKG Interpretation None       Radiology No results found.  Procedures Procedures (including critical care time)  Medications Ordered in ED Medications  amoxicillin-clavulanate (AUGMENTIN) 875-125 MG per tablet 1 tablet (not administered)     Initial Impression / Assessment and Plan / ED Course  I have reviewed the triage vital signs and the nursing notes.  Pertinent labs & imaging results that were available during my care of the patient were reviewed by me and considered in my medical decision making (see chart for details).    Patient with recent URI. No with OM.  Treated previously with z-pak.  Will step up antibiotic to Augmentin.  I personally performed the services described in this documentation, which was scribed in my presence. The recorded information has been reviewed and is accurate.     Final Clinical Impressions(s) / ED Diagnoses   Final diagnoses:  Acute otitis media, unspecified otitis media type    New Prescriptions New  Prescriptions   AMOXICILLIN-CLAVULANATE (AUGMENTIN) 875-125 MG TABLET    Take 1 tablet by mouth every 12 (twelve) hours.     Laurie Horseman, PA-C 06/11/16 2156    Shaune Pollack, MD 06/12/16 [phone]

## 2016-06-18 ENCOUNTER — Emergency Department (HOSPITAL_COMMUNITY)
Admission: EM | Admit: 2016-06-18 | Discharge: 2016-06-18 | Disposition: A | Discharge status: Home / Self Care | Payer: 59 | Attending: Emergency Medicine | Admitting: Emergency Medicine

## 2016-06-18 ENCOUNTER — Encounter (HOSPITAL_COMMUNITY): Payer: Self-pay | Admitting: *Deleted

## 2016-06-18 DIAGNOSIS — Z79899 Other long term (current) drug therapy: Secondary | ICD-10-CM | POA: Diagnosis not present

## 2016-06-18 DIAGNOSIS — N9089 Other specified noninflammatory disorders of vulva and perineum: Secondary | ICD-10-CM | POA: Insufficient documentation

## 2016-06-18 DIAGNOSIS — R102 Pelvic and perineal pain unspecified side: Secondary | ICD-10-CM

## 2016-06-18 DIAGNOSIS — N949 Unspecified condition associated with female genital organs and menstrual cycle: Secondary | ICD-10-CM

## 2016-06-18 LAB — URINALYSIS, ROUTINE W REFLEX MICROSCOPIC
Bacteria, UA: NONE SEEN
Bilirubin Urine: NEGATIVE
Glucose, UA: NEGATIVE mg/dL
Hgb urine dipstick: NEGATIVE
Ketones, ur: NEGATIVE mg/dL
Nitrite: NEGATIVE
Protein, ur: NEGATIVE mg/dL
Specific Gravity, Urine: 1.023 (ref 1.005–1.030)
pH: 7 (ref 5.0–8.0)

## 2016-06-18 LAB — WET PREP, GENITAL
Clue Cells Wet Prep HPF POC: NONE SEEN
Sperm: NONE SEEN
Trich, Wet Prep: NONE SEEN

## 2016-06-18 LAB — POC URINE PREG, ED: Preg Test, Ur: NEGATIVE

## 2016-06-18 MED ORDER — NAPROXEN 375 MG PO TABS: 375.0000 mg | ORAL_TABLET | Freq: Two times a day (BID) | ORAL | 0 refills | Status: DC

## 2016-06-18 MED ORDER — VALACYCLOVIR HCL 1 G PO TABS: 1000.0000 mg | ORAL_TABLET | Freq: Two times a day (BID) | ORAL | 0 refills | Status: AC

## 2016-06-18 NOTE — ED Provider Notes (Signed)
[facility] Provider Note   CSN: [id number] Arrival date & time: 06/18/16  1708  By signing my name below, I, Ryan Long and Doreatha Martin , attest that this documentation has been prepared under the direction and in the presence of [facility] M. Damian Leavell, NP Electronically Signed: Garen Lah and Doreatha Martin, Scribe. 06/18/2016. 6:56 PM.   History   Chief Complaint No chief complaint on file.   The history is provided by the patient and a significant other. No language interpreter was used.  Illness  This is a new problem. The current episode started more than 2 days ago. The problem occurs constantly. The problem has been gradually worsening. Exacerbated by: wiping. Nothing relieves the symptoms. She has tried nothing for the symptoms. The treatment provided no relief.    HPI Comments:  Laurie Reid is a 26 y.o. female who presents to the Emergency Department complaining of gradually worsening, constant 9/10 vaginal pain onset three nights ago. Pt notes her symptoms worsen when wiping with toilet tissue. She also reports noticing blood on her toilet tissue this afternoon. Pt reports she has been with her current partner for 3 years. She has no h/o STD.  LMP ~1 month ago. Pt states she and her partner had oral sex 3 days prior to the onset of her symptoms. Her partner denies fever blisters or oral ulcers. Pt denies dysuria, hematuria, vaginal odor or discharge, nausea, vomiting, fever, chills.  History reviewed. No pertinent past medical history.  There are no active problems to display for this patient.   Past Surgical History:  Procedure Laterality Date  . KNEE SURGERY Right 2009    OB History    No data available       Home Medications    Prior to Admission medications   Medication Sig Start Date End Date Taking? Authorizing Provider  amoxicillin-clavulanate (AUGMENTIN) 875-125 MG tablet Take 1 tablet by mouth every 12 (twelve) hours. 06/11/16   Roxy Horseman, PA-C    azithromycin (ZITHROMAX) 250 MG tablet Take 1 tablet (250 mg total) by mouth daily. Take first 2 tablets together, then 1 every day until finished. 06/03/16   Bethel Born, PA-C  metroNIDAZOLE (FLAGYL) 500 MG tablet Take 1 tablet (500 mg total) by mouth 2 (two) times daily. 02/23/15   Renne Crigler, PA-C  naproxen (NAPROSYN) 375 MG tablet Take 1 tablet (375 mg total) by mouth 2 (two) times daily. 06/18/16   Brailee Riede Orlene Och, NP  naproxen sodium (ANAPROX) 220 MG tablet Take 440 mg by mouth 2 (two) times daily with a meal.    Historical Provider, MD  permethrin (ELIMITE) 5 % cream Apply to body once before bed, leave on overnight (at least 8 hours), and wash off in morning. Repeat in one week if not improved. 02/23/15   Renne Crigler, PA-C  Pseudoephedrine-APAP-DM (DAYQUIL PO) Take 1 tablet by mouth daily as needed (congestion).    Historical Provider, MD  valACYclovir (VALTREX) 1000 MG tablet Take 1 tablet (1,000 mg total) by mouth 2 (two) times daily. 06/18/16   Horace Lukas Orlene Och, NP    Family History No family history on file.  Social History Social History  Substance Use Topics  . Smoking status: Never Smoker  . Smokeless tobacco: Never Used  . Alcohol use No     Allergies   Patient has no known allergies.   Review of Systems Review of Systems  Constitutional: Negative for chills and fever.  Gastrointestinal: Negative for nausea and vomiting.  Genitourinary:  Positive for vaginal pain. Negative for dysuria and hematuria.  All other systems reviewed and are negative.  Physical Exam Updated Vital Signs BP 99/56 (BP Location: Right Arm)   Pulse 75   Temp 97.7 F (36.5 C) (Oral)   Resp 16   Ht 5\' 6"  (1.676 m)   Wt 65.8 kg   LMP 05/18/2016   SpO2 99%   BMI 23.42 kg/m   Physical Exam  Constitutional: She appears well-developed and well-nourished. No distress.  HENT:  Head: Normocephalic and atraumatic.  Eyes: Conjunctivae are normal.  Neck: Normal range of motion. Neck supple.   Cardiovascular: Normal rate.   Pulmonary/Chest: Effort normal. No respiratory distress.  Abdominal: Soft. There is no tenderness.  Genitourinary:  Genitourinary Comments: External genitalia with irritation and lesions noted to the left labia, tender on exam. Bloody mucopurulent discharge vaginal vault, positive CMT, no adnexal tenderness or mass palpated, uterus without palpable enlargement. Chaperone present throughout entire exam.   Musculoskeletal: She exhibits no edema.  Neurological: She is alert.  Skin: Skin is warm and dry.  Psychiatric: She has a normal mood and affect.  Nursing note and vitals reviewed.    ED Treatments / Results  DIAGNOSTIC STUDIES:  Oxygen Saturation is 99% on RA, normal by my interpretation.    COORDINATION OF CARE:  6:31 PM Discussed treatment plan with pt at bedside and pt agreed to plan.  Labs (all labs ordered are listed, but only abnormal results are displayed) Labs Reviewed  WET PREP, GENITAL - Abnormal; Notable for the following:       Result Value   Yeast Wet Prep HPF POC PRESENT (*)    WBC, Wet Prep HPF POC MODERATE (*)    All other components within normal limits  URINALYSIS, ROUTINE W REFLEX MICROSCOPIC - Abnormal; Notable for the following:    APPearance CLOUDY (*)    Leukocytes, UA MODERATE (*)    Squamous Epithelial / LPF 0-5 (*)    All other components within normal limits  HSV CULTURE AND TYPING  RPR  HIV ANTIBODY (ROUTINE TESTING)  POC URINE PREG, ED  GC/CHLAMYDIA PROBE AMP ([place]) NOT AT [facility]   Radiology No results found.  Procedures Procedures (including critical care time)  Medications Ordered in ED Medications - No data to display   Initial Impression / Assessment and Plan / ED Course  I have reviewed the triage vital signs and the nursing notes.  Pertinent lab results that were available during my care of the patient were reviewed by me and considered in my medical decision making (see chart for  details).     Final Clinical Impressions(s) / ED Diagnoses  26 y.o. female with vaginal pain and lesions stable for d/c without fever, difficulty voiding and does not appear toxic. Will treat for possible HSV while cultures pending. Discussed with the patient clinical findings and plan of care. She voices understanding and agrees with plan.   Final diagnoses:  Vaginal pain  Genital lesion, female    New Prescriptions Discharge Medication List as of 06/18/2016  7:45 PM    START taking these medications   Details  naproxen (NAPROSYN) 375 MG tablet Take 1 tablet (375 mg total) by mouth 2 (two) times daily., Starting Sun 06/18/2016, Print    valACYclovir (VALTREX) 1000 MG tablet Take 1 tablet (1,000 mg total) by mouth 2 (two) times daily., Starting Sun 06/18/2016, Print        I personally performed the services described in this documentation, which  was scribed in my presence. The recorded information has been reviewed and is accurate.    [address] [place], [place] 06/19/16 9604    Margarita Grizzle, MD 06/21/16 [phone]

## 2016-06-18 NOTE — ED Triage Notes (Signed)
The pt is c/o vaginal irritation since Thursday getting worse today  She has a little blood whenever she wipes.  lmp dec

## 2016-06-18 NOTE — Discharge Instructions (Signed)
We have sent a culture to see if the lesions and pain are a herpes infection. We will give you medication to start so if it is herpes it will help shorten the course. Follow up with the health department for pap smear and regular check up.  We also sent cultures for gonorrhea and chlamydia. If any of your cultures are positive someone will call you.

## 2016-06-18 NOTE — ED Notes (Signed)
[facility] and Kenney Housemananya - EMT assisted.

## 2016-06-19 LAB — HIV ANTIBODY (ROUTINE TESTING W REFLEX): HIV Screen 4th Generation wRfx: NONREACTIVE

## 2016-06-19 LAB — RPR: RPR Ser Ql: NONREACTIVE

## 2016-06-20 LAB — GC/CHLAMYDIA PROBE AMP (~~LOC~~) NOT AT ARMC
Chlamydia: NEGATIVE
Neisseria Gonorrhea: NEGATIVE

## 2016-06-21 LAB — HSV CULTURE AND TYPING

## 2018-09-07 ENCOUNTER — Emergency Department (HOSPITAL_COMMUNITY): Payer: PRIVATE HEALTH INSURANCE

## 2018-09-07 ENCOUNTER — Other Ambulatory Visit: Payer: Self-pay

## 2018-09-07 ENCOUNTER — Encounter (HOSPITAL_COMMUNITY): Payer: Self-pay | Admitting: *Deleted

## 2018-09-07 ENCOUNTER — Emergency Department (HOSPITAL_COMMUNITY)
Admission: EM | Admit: 2018-09-07 | Discharge: 2018-09-07 | Disposition: A | Discharge status: Home / Self Care | Payer: PRIVATE HEALTH INSURANCE | Attending: Emergency Medicine | Admitting: Emergency Medicine

## 2018-09-07 DIAGNOSIS — K8020 Calculus of gallbladder without cholecystitis without obstruction: Secondary | ICD-10-CM | POA: Insufficient documentation

## 2018-09-07 DIAGNOSIS — Z791 Long term (current) use of non-steroidal anti-inflammatories (NSAID): Secondary | ICD-10-CM | POA: Insufficient documentation

## 2018-09-07 DIAGNOSIS — Z79899 Other long term (current) drug therapy: Secondary | ICD-10-CM | POA: Insufficient documentation

## 2018-09-07 DIAGNOSIS — R109 Unspecified abdominal pain: Secondary | ICD-10-CM

## 2018-09-07 DIAGNOSIS — R112 Nausea with vomiting, unspecified: Secondary | ICD-10-CM | POA: Insufficient documentation

## 2018-09-07 LAB — COMPREHENSIVE METABOLIC PANEL
ALT: 41 U/L (ref 0–44)
AST: 42 U/L — ABNORMAL HIGH (ref 15–41)
Albumin: 4.6 g/dL (ref 3.5–5.0)
Alkaline Phosphatase: 72 U/L (ref 38–126)
Anion gap: 14 (ref 5–15)
BUN: 9 mg/dL (ref 6–20)
CO2: 23 mmol/L (ref 22–32)
Calcium: 9.7 mg/dL (ref 8.9–10.3)
Chloride: 102 mmol/L (ref 98–111)
Creatinine, Ser: 0.82 mg/dL (ref 0.44–1.00)
GFR calc Af Amer: >60 mL/min (ref >60)
GFR calc non Af Amer: >60 mL/min (ref >60)
Glucose, Bld: 113 mg/dL — ABNORMAL HIGH (ref 70–99)
Potassium: 3.4 mmol/L — ABNORMAL LOW (ref 3.5–5.1)
Sodium: 139 mmol/L (ref 135–145)
Total Bilirubin: 0.8 mg/dL (ref 0.3–1.2)
Total Protein: 7.9 g/dL (ref 6.5–8.1)

## 2018-09-07 LAB — CBC
HCT: 42.3 % (ref 36.0–46.0)
Hemoglobin: 13.9 g/dL (ref 12.0–15.0)
MCH: 28.8 pg (ref 26.0–34.0)
MCHC: 32.9 g/dL (ref 30.0–36.0)
MCV: 87.6 fL (ref 80.0–100.0)
Platelets: 321 10*3/uL (ref 150–400)
RBC: 4.83 MIL/uL (ref 3.87–5.11)
RDW: 12.3 % (ref 11.5–15.5)
WBC: 5.3 10*3/uL (ref 4.0–10.5)
nRBC: 0 % (ref 0.0–0.2)

## 2018-09-07 LAB — I-STAT BETA HCG BLOOD, ED (MC, WL, AP ONLY): I-stat hCG, quantitative: <5 m[IU]/mL (ref <5)

## 2018-09-07 LAB — URINALYSIS, ROUTINE W REFLEX MICROSCOPIC
Bilirubin Urine: NEGATIVE
Glucose, UA: NEGATIVE mg/dL
Hgb urine dipstick: NEGATIVE
Ketones, ur: 20 mg/dL — AB
Leukocytes,Ua: NEGATIVE
Nitrite: NEGATIVE
Protein, ur: NEGATIVE mg/dL
Specific Gravity, Urine: 1.02 (ref 1.005–1.030)
pH: 6 (ref 5.0–8.0)

## 2018-09-07 LAB — LIPASE, BLOOD: Lipase: 31 U/L (ref 11–51)

## 2018-09-07 MED ORDER — FAMOTIDINE IN NACL 20-0.9 MG/50ML-% IV SOLN
20.0000 mg | Freq: Once | INTRAVENOUS | Status: AC
  Administered 2018-09-07: 20 mg via INTRAVENOUS
  Filled 2018-09-07: qty 50

## 2018-09-07 MED ORDER — SODIUM CHLORIDE 0.9 % IV BOLUS
500.0000 mL | Freq: Once | INTRAVENOUS | Status: AC
  Administered 2018-09-07: 500 mL via INTRAVENOUS

## 2018-09-07 MED ORDER — TECHNETIUM TC 99M MEBROFENIN IV KIT
5.0000 | PACK | Freq: Once | INTRAVENOUS | Status: AC | PRN
  Administered 2018-09-07: 5 via INTRAVENOUS

## 2018-09-07 MED ORDER — OMEPRAZOLE 20 MG PO CPDR: 20.0000 mg | DELAYED_RELEASE_CAPSULE | Freq: Every day | ORAL | 0 refills | Status: DC

## 2018-09-07 MED ORDER — PROMETHAZINE HCL 12.5 MG PO TABS: 12.5000 mg | ORAL_TABLET | Freq: Four times a day (QID) | ORAL | 0 refills | Status: AC | PRN

## 2018-09-07 MED ORDER — METOCLOPRAMIDE HCL 5 MG/ML IJ SOLN
10.0000 mg | Freq: Once | INTRAMUSCULAR | Status: AC
  Administered 2018-09-07: 09:00:00 10 mg via INTRAVENOUS
  Filled 2018-09-07: qty 2

## 2018-09-07 MED ORDER — SODIUM CHLORIDE 0.9% FLUSH
3.0000 mL | Freq: Once | INTRAVENOUS | Status: AC
  Administered 2018-09-07: 07:00:00 3 mL via INTRAVENOUS

## 2018-09-07 MED ORDER — SODIUM CHLORIDE 0.9 % IV BOLUS
1000.0000 mL | Freq: Once | INTRAVENOUS | Status: AC
  Administered 2018-09-07: 1000 mL via INTRAVENOUS

## 2018-09-07 MED ORDER — ONDANSETRON HCL 4 MG/2ML IJ SOLN
INTRAMUSCULAR | Status: AC
  Filled 2018-09-07: qty 2

## 2018-09-07 MED ORDER — FENTANYL CITRATE (PF) 100 MCG/2ML IJ SOLN
50.0000 ug | Freq: Once | INTRAMUSCULAR | Status: AC
  Administered 2018-09-07: 50 ug via INTRAVENOUS
  Filled 2018-09-07: qty 2

## 2018-09-07 MED ORDER — ONDANSETRON HCL 4 MG/2ML IJ SOLN
4.0000 mg | Freq: Once | INTRAMUSCULAR | Status: AC
  Administered 2018-09-07: 4 mg via INTRAVENOUS

## 2018-09-07 MED ORDER — PROMETHAZINE HCL 25 MG/ML IJ SOLN
12.5000 mg | Freq: Once | INTRAMUSCULAR | Status: AC
  Administered 2018-09-07: 12.5 mg via INTRAVENOUS
  Filled 2018-09-07: qty 1

## 2018-09-07 MED ORDER — LORAZEPAM 2 MG/ML IJ SOLN
0.5000 mg | Freq: Once | INTRAMUSCULAR | Status: AC
  Administered 2018-09-07: 0.5 mg via INTRAVENOUS
  Filled 2018-09-07: qty 1

## 2018-09-07 NOTE — ED Notes (Signed)
Assisted pt to bedpan, unable to urinate at this time to provide urine sample

## 2018-09-07 NOTE — ED Notes (Signed)
Pt states she understands instructions and home stable with steady gait. 

## 2018-09-07 NOTE — ED Notes (Signed)
Patient transported to X-ray 

## 2018-09-07 NOTE — ED Notes (Signed)
Patient back from [facility]

## 2018-09-07 NOTE — ED Notes (Signed)
Spoke to pt about need for urine sample and offered to assist with bedpan or bedside commode. Pt states she cannot go at this time, will use call light when she feels urge

## 2018-09-07 NOTE — Discharge Instructions (Addendum)
You were seen in the ER today for nausea, vomiting, and abdominal pain. Your work-up was overall reassuring. Your labs were normal with the exception that your potassium was somewhat low at 3.4 (normal is 3.5-5.1) and you had some ketones in your urine- we suspect these findings are due to dehydration with vomiting. Please follow the attached potassium food guidelines.   Your ultrasound showed that you had a gallstone and your gallbladder looked somewhat abnormal. For this reason we obtained what is called HIDA scan to further evaluate this. We are waiting on the results and if they got enough pictures to determine if there is an infection I will call you. Gallstones may be contributing to your symptoms, but it does not appear that your gallbladder needs to be emergently removed.   We are sending you home with prilosec to help with acid build up in the stomach and phenergan should your nausea/vomiting come back. Please take these as prescribed.   We have prescribed you new medication(s) today. Discuss the medications prescribed today with your pharmacist as they can have adverse effects and interactions with your other medicines including over the counter and prescribed medications. Seek medical evaluation if you start to experience new or abnormal symptoms after taking one of these medicines, seek care immediately if you start to experience difficulty breathing, feeling of your throat closing, facial swelling, or rash as these could be indications of a more serious allergic reaction  Follow up with primary care within 3 days. We have also provided you with general surgery follow up for your gallstone. Return to the ER for new or worsening symptoms including but not limited to fever, worsened pain, inability to keep fluids down, blood in vomit/stool, or any other concerns.

## 2018-09-07 NOTE — ED Notes (Signed)
Scan discussed briefly with pt and then she returns to sleep.

## 2018-09-07 NOTE — ED Notes (Signed)
Pt's boyfriend given brochure and pt's room phone number for an update.

## 2018-09-07 NOTE — ED Provider Notes (Signed)
[facility] EMERGENCY DEPARTMENT Provider Note   CSN: [id number] Arrival date & time: 09/07/18  2952   History   Chief Complaint Chief Complaint  Patient presents with   Emesis    HPI Laurie Reid is a 28 y.o. female without significant past medical hx who presents to the ED with complaints of nausea & vomiting which began at 0800 last evening. Patient states that shortly after she ate a burger she started to feel nauseous w/ emesis. Noted TNTC episode of vomiting. Reports after onset of vomiting developed pain primarily to the epigastrium/RUQ area. Pain is constant, achy/sharp in nature, worse just prior to vomiting, improved after vomiting. She feels it may be food poisoning. Denies fever, chills, hematemesis, melena, hematochezia, diarrhea, constipation, dysuria, vaginal bleeding, or vaginal discharge. LMP 6 days prior. Denies marijuana use. No EtOH within past 24 hours.      HPI  History reviewed. No pertinent past medical history.  There are no active problems to display for this patient.   Past Surgical History:  Procedure Laterality Date   KNEE SURGERY Right 2009     OB History   No obstetric history on file.      Home Medications    Prior to Admission medications   Medication Sig Start Date End Date Taking? Authorizing Provider  amoxicillin-clavulanate (AUGMENTIN) 875-125 MG tablet Take 1 tablet by mouth every 12 (twelve) hours. 06/11/16   Roxy Horseman, PA-C  azithromycin (ZITHROMAX) 250 MG tablet Take 1 tablet (250 mg total) by mouth daily. Take first 2 tablets together, then 1 every day until finished. 06/03/16   Bethel Born, PA-C  metroNIDAZOLE (FLAGYL) 500 MG tablet Take 1 tablet (500 mg total) by mouth 2 (two) times daily. 02/23/15   Renne Crigler, PA-C  naproxen (NAPROSYN) 375 MG tablet Take 1 tablet (375 mg total) by mouth 2 (two) times daily. 06/18/16   [name], NP  naproxen sodium (ANAPROX) 220 MG tablet Take 440 mg by  mouth 2 (two) times daily with a meal.    [provider]  permethrin (ELIMITE) 5 % cream Apply to body once before bed, leave on overnight (at least 8 hours), and wash off in morning. Repeat in one week if not improved. 02/23/15   Renne Crigler, PA-C  Pseudoephedrine-APAP-DM (DAYQUIL PO) Take 1 tablet by mouth daily as needed (congestion).    [provider]  valACYclovir (VALTREX) 1000 MG tablet Take 1 tablet (1,000 mg total) by mouth 2 (two) times daily. 06/18/16   [name], NP    Family History No family history on file.  Social History Social History   Tobacco Use   Smoking status: Never Smoker   Smokeless tobacco: Never Used  Substance Use Topics   Alcohol use: Yes   Drug use: No     Allergies   Patient has no known allergies.   Review of Systems Review of Systems  Constitutional: Negative for chills and fever.  Respiratory: Negative for shortness of breath.   Cardiovascular: Negative for chest pain.  Gastrointestinal: Positive for abdominal pain, nausea and vomiting. Negative for anal bleeding, blood in stool, constipation and diarrhea.  Genitourinary: Negative for dysuria, vaginal bleeding and vaginal discharge.  Neurological: Negative for syncope.  All other systems reviewed and are negative.  Physical Exam Updated Vital Signs BP (!) 101/59 (BP Location: Right Arm)    Pulse 99    Temp 97.6 F (36.4 C)    Resp 17    Ht  5\' 6"  (1.676 m)    Wt 68 kg    LMP 08/07/2018    SpO2 100%    BMI 24.21 kg/m     Physical Exam Vitals signs and nursing note reviewed.  Constitutional:      Appearance: She is well-developed. She is not toxic-appearing.     Comments: retching on exam.   HENT:     Head: Normocephalic and atraumatic.  Eyes:     General:        Right eye: No discharge.        Left eye: No discharge.     Conjunctiva/sclera: Conjunctivae normal.  Neck:     Musculoskeletal: Neck supple.  Cardiovascular:     Rate and Rhythm: Normal  rate and regular rhythm.  Pulmonary:     Effort: Pulmonary effort is normal. No respiratory distress.     Breath sounds: Normal breath sounds. No wheezing, rhonchi or rales.  Abdominal:     General: There is no distension.     Palpations: Abdomen is soft.     Tenderness: There is abdominal tenderness (mild generalized, moderate in epigastric area & RUQ). There is no right CVA tenderness, left CVA tenderness, guarding or rebound. Negative signs include Murphy's sign and McBurney's sign.  Skin:    General: Skin is warm and dry.     Findings: No rash.  Neurological:     Mental Status: She is alert.     Comments: Clear speech.   Psychiatric:        Behavior: Behavior normal.    ED Treatments / Results  Labs (all labs ordered are listed, but only abnormal results are displayed) Labs Reviewed  COMPREHENSIVE METABOLIC PANEL - Abnormal; Notable for the following components:      Result Value   Potassium 3.4 (*)    Glucose, Bld 113 (*)    AST 42 (*)    All other components within normal limits  URINALYSIS, ROUTINE W REFLEX MICROSCOPIC - Abnormal; Notable for the following components:   Ketones, ur 20 (*)    All other components within normal limits  LIPASE, BLOOD  CBC  I-STAT BETA HCG BLOOD, ED ([facility], [facility], [facility] ONLY)    EKG None  Radiology [place] Abdomen Limited Ruq  Result Date: 09/07/2018 CLINICAL DATA:  Abdominal pain. EXAM: ULTRASOUND ABDOMEN LIMITED RIGHT UPPER QUADRANT COMPARISON:  None. FINDINGS: Gallbladder: There is a single 1.2 cm stone in a mildly distended gallbladder. No wall thickening or pericholecystic fluid. A Murphy's sign cannot be assessed due to pain medications. Common bile duct: Diameter: 5 mm Liver: No focal lesion identified. Within normal limits in parenchymal echogenicity. Portal vein is patent on color Doppler imaging with normal direction of blood flow towards the liver. IMPRESSION: 1. A single stone is identified in a mildly distended gallbladder with no wall  thickening or pericholecystic fluid. A Murphy's sign could not be assessed due to pain medications. If there is continued concern for acute cholecystitis, a HIDA scan could further evaluate. Electronically Signed   By: Gerome Samavid  Williams III M.D   On: 09/07/2018 08:19    Procedures Procedures (including critical care time)  Medications Ordered in ED Medications  ondansetron [facility](ZOFRAN) 4 MG/2ML injection (  Not Given 09/07/18 0646)  sodium chloride flush (NS) 0.9 % injection 3 mL (3 mLs Intravenous Given 09/07/18 0644)  sodium chloride 0.9 % bolus 1,000 mL (1,000 mLs Intravenous New Bag/Given 09/07/18 0645)  ondansetron (ZOFRAN) injection 4 mg (4 mg Intravenous Given 09/07/18 0645)  Initial Impression / Assessment and Plan / ED Course  I have reviewed the triage vital signs and the nursing notes.  Pertinent labs & imaging results that were available during my care of the patient were reviewed by me and considered in my medical decision making (see chart for details).    Patient presents to the ED with complaints of N/V w/ associated abdominal pain. Patient nontoxic appearing, in no apparent distress, vitals WNL upon arrival. On exam patient tender to generalized abdomen some, more so in the epigastrium & RUQ, no peritoneal signs. Will evaluate with labs and RUQ [place]. Anti-acids, anti-emetics, and fluids administered.   ER work-up reviewed:  CBC: No leukocytosis. No anemia.  CMP: Mild hypokalemia @ 3.4. AST minimally elevated @ 42. Otherwise unremarkable.  Lipase: WNL UA: No UTI, ketonuria present, receiving fluids Preg test: Negative- doubt ectopic.  RUQ [place]: IMPRESSION: 1. A single stone is identified in a mildly distended gallbladder with no wall thickening or pericholecystic fluid. A Murphy's sign could not be assessed due to pain medications. If there is continued concern for acute cholecystitis, a HIDA scan could further evaluate. Electronically Signed   By: Gerome Sam III M.D   On:  09/07/2018 08:19   08:20: RE-EVAL: Patient remains uncomfortable & nauseated, no emesis s/p phenergan. Continued epigastric & RUQ tenderness to palpation.   Discussed findings & plan of care with supervising physician Dr. Ranae Palms- plan for reglan, fentanyl, and consult to general surgery.   09:00: CONSULT: Discussed case with general surgeon Dr. Derrell Lolling- recommends HIDA scan.   Informed by NM technician that patient would be unable to have her HIDA scan performed until 6 hours since last PO intake/narcotic administration. Unfortunately fentanyl had been previously administered @ 08:58 prior to being aware of this information. Will hold for HIDA   09:32: RE-EVAL: Continued dry heaving/emesis. Ativan ordered.   02:45: RE-EVAL: Patient has not had any vomiting s/p ativan, she is feeling improved.. Pressures have been soft in the ER, upon chart review pressures seem to run on the lower end, patient has primarily been sleeping, do improve with being upright.  Continue to monitor.  16:00: Patient care signed out to Aviva Kluver PA-C at change of shift pending HIDA scan & appropriate disposition.   Final Clinical Impressions(s) / ED Diagnoses   Final diagnoses:  Abdominal pain  Non-intractable vomiting with nausea, unspecified vomiting type  Calculus of gallbladder without cholecystitis without obstruction    ED Discharge Orders    None       Cherly Anderson, PA-C 09/07/18 1547    Loren Racer, MD 10/01/18 [phone]

## 2018-09-07 NOTE — ED Notes (Signed)
Pt urged to provide urine specimen

## 2018-09-07 NOTE — ED Triage Notes (Signed)
The pt had a burger approx 2000  Soon after she had abd pain nausea vomiting  No diarrhea  She feels like this is  Food poisoning  As she has had in the past  lmp  marcjh

## 2018-09-07 NOTE — ED Notes (Addendum)
Pt states she must leave to pick up child. PA provider at bedside. Leaving discussed with pt and provider.

## 2018-09-07 NOTE — ED Provider Notes (Signed)
Physical Exam  BP (!) 92/57   Pulse 67   Temp 97.6 F (36.4 C)   Resp 16   Ht 5\' 6"  (1.676 m)   Wt 68 kg   LMP 08/07/2018   SpO2 98%   BMI 24.21 kg/m   [facility]-C at 1600. Briefly, the patient is a 28 y.o. female with PMHx of  has no past medical history on file. here with nausea, vomiting, and right upper quadrant pain.  Patient had difficulty controlling nausea and vomiting despite multiple antiemetics.  Patient had upper quadrant ultrasound that demonstrated no peri-cholestatic fluid or gallbladder wall thickening, however it was slightly distended, patient did have a gallstone.  Surgery was contacted and HIDA scan recommended.  Labs Reviewed  COMPREHENSIVE METABOLIC PANEL - Abnormal; Notable for the following components:      Result Value   Potassium 3.4 (*)    Glucose, Bld 113 (*)    AST 42 (*)    All other components within normal limits  URINALYSIS, ROUTINE W REFLEX MICROSCOPIC - Abnormal; Notable for the following components:   Ketones, ur 20 (*)    All other components within normal limits  LIPASE, BLOOD  CBC  I-STAT BETA HCG BLOOD, ED ([facility], [facility], [facility] ONLY)    Course of Care:   Physical Exam Vitals signs and nursing note reviewed.  Constitutional:      General: She is not in acute distress.    Appearance: She is well-developed. She is not diaphoretic.     Comments: Sitting comfortably in bed.  HENT:     Head: Normocephalic and atraumatic.  Eyes:     General:        Right eye: No discharge.        Left eye: No discharge.     Conjunctiva/sclera: Conjunctivae normal.     Comments: EOMs normal to gross examination.  Neck:     Musculoskeletal: Normal range of motion.  Cardiovascular:     Rate and Rhythm: Normal rate and regular rhythm.     Comments: Intact, 2+ radial pulse. Pulmonary:     Comments: Converses comfortably. No audible wheeze or stridor. Abdominal:     General: There is no distension.  Musculoskeletal: Normal range  of motion.  Skin:    General: Skin is warm and dry.  Neurological:     Mental Status: She is alert.     Comments: Cranial nerves intact to gross observation. Patient moves extremities without difficulty.  Psychiatric:        Behavior: Behavior normal.        Thought Content: Thought content normal.        Judgment: Judgment normal.     ED Course/Procedures   Clinical Course as of Sep 07 1919  Sat Sep 07, 2018  1701 Pt reports she cannot wait any longer.    [AM]    Clinical Course User Index [AM] Elisha PonderMurray, Reinaldo Helt B, PA-C    Procedures  MDM   If patient has HIDA scan negative for cholecystitis, will re-contact general surgery.   I was informed by RN that nuclear medicine called due to patient self terminating the exam due to needing to immediately watch her children, as the babysitter had to go to work.  Examination was not completed, however the portion that was was read and showed no evidence of cholecystitis or other abnormality.  There was some pooling of the tracer, and patient did not elect to repeat the exam.  I  did discuss with the patient there is a chance that we have a suboptimal exam, and if she is worsening at any point she should return to the emergency department mediately.  She reports she is overall comfortable and not nauseous currently.  Patient prescribed antiemetics per previous PA.       Delia Chimes 09/07/18 1925    Eber Hong, MD 09/09/18 1150

## 2018-11-29 ENCOUNTER — Emergency Department (HOSPITAL_COMMUNITY)
Admission: EM | Admit: 2018-11-29 | Discharge: 2018-11-29 | Disposition: A | Discharge status: Home / Self Care | Payer: PRIVATE HEALTH INSURANCE | Attending: Emergency Medicine | Admitting: Emergency Medicine

## 2018-11-29 ENCOUNTER — Other Ambulatory Visit: Payer: Self-pay

## 2018-11-29 ENCOUNTER — Encounter (HOSPITAL_COMMUNITY): Payer: Self-pay | Admitting: Emergency Medicine

## 2018-11-29 DIAGNOSIS — E860 Dehydration: Secondary | ICD-10-CM

## 2018-11-29 DIAGNOSIS — R112 Nausea with vomiting, unspecified: Secondary | ICD-10-CM | POA: Diagnosis not present

## 2018-11-29 LAB — COMPREHENSIVE METABOLIC PANEL
ALT: 23 U/L (ref 0–44)
AST: 19 U/L (ref 15–41)
Albumin: 4 g/dL (ref 3.5–5.0)
Alkaline Phosphatase: 44 U/L (ref 38–126)
Anion gap: 10 (ref 5–15)
BUN: 5 mg/dL — ABNORMAL LOW (ref 6–20)
CO2: 20 mmol/L — ABNORMAL LOW (ref 22–32)
Calcium: 8.8 mg/dL — ABNORMAL LOW (ref 8.9–10.3)
Chloride: 109 mmol/L (ref 98–111)
Creatinine, Ser: 0.62 mg/dL (ref 0.44–1.00)
GFR calc Af Amer: >60 mL/min (ref >60)
GFR calc non Af Amer: >60 mL/min (ref >60)
Glucose, Bld: 91 mg/dL (ref 70–99)
Potassium: 3.4 mmol/L — ABNORMAL LOW (ref 3.5–5.1)
Sodium: 139 mmol/L (ref 135–145)
Total Bilirubin: 1 mg/dL (ref 0.3–1.2)
Total Protein: 6.5 g/dL (ref 6.5–8.1)

## 2018-11-29 LAB — URINALYSIS, ROUTINE W REFLEX MICROSCOPIC
Bacteria, UA: NONE SEEN
Bilirubin Urine: NEGATIVE
Glucose, UA: NEGATIVE mg/dL
Ketones, ur: 20 mg/dL — AB
Leukocytes,Ua: NEGATIVE
Nitrite: NEGATIVE
Protein, ur: NEGATIVE mg/dL
Specific Gravity, Urine: 1.012 (ref 1.005–1.030)
pH: 7 (ref 5.0–8.0)

## 2018-11-29 LAB — CBC WITH DIFFERENTIAL/PLATELET
Abs Immature Granulocytes: 0.01 10*3/uL (ref 0.00–0.07)
Basophils Absolute: 0 10*3/uL (ref 0.0–0.1)
Basophils Relative: 1 %
Eosinophils Absolute: 0.1 10*3/uL (ref 0.0–0.5)
Eosinophils Relative: 2 %
HCT: 36.5 % (ref 36.0–46.0)
Hemoglobin: 12.5 g/dL (ref 12.0–15.0)
Immature Granulocytes: 0 %
Lymphocytes Relative: 22 %
Lymphs Abs: 1.2 10*3/uL (ref 0.7–4.0)
MCH: 30.4 pg (ref 26.0–34.0)
MCHC: 34.2 g/dL (ref 30.0–36.0)
MCV: 88.8 fL (ref 80.0–100.0)
Monocytes Absolute: 0.5 10*3/uL (ref 0.1–1.0)
Monocytes Relative: 8 %
Neutro Abs: 3.7 10*3/uL (ref 1.7–7.7)
Neutrophils Relative %: 67 %
Platelets: 240 10*3/uL (ref 150–400)
RBC: 4.11 MIL/uL (ref 3.87–5.11)
RDW: 12.4 % (ref 11.5–15.5)
WBC: 5.5 10*3/uL (ref 4.0–10.5)
nRBC: 0 % (ref 0.0–0.2)

## 2018-11-29 LAB — I-STAT BETA HCG BLOOD, ED (MC, WL, AP ONLY): I-stat hCG, quantitative: <5 m[IU]/mL (ref <5)

## 2018-11-29 LAB — LIPASE, BLOOD: Lipase: 29 U/L (ref 11–51)

## 2018-11-29 MED ORDER — MORPHINE SULFATE (PF) 4 MG/ML IV SOLN
4.0000 mg | Freq: Once | INTRAVENOUS | Status: AC
  Administered 2018-11-29: 4 mg via INTRAVENOUS
  Filled 2018-11-29: qty 1

## 2018-11-29 MED ORDER — SODIUM CHLORIDE 0.9 % IV BOLUS
1000.0000 mL | Freq: Once | INTRAVENOUS | Status: AC
  Administered 2018-11-29: 1000 mL via INTRAVENOUS

## 2018-11-29 MED ORDER — ONDANSETRON 4 MG PO TBDP: 4.0000 mg | ORAL_TABLET | Freq: Three times a day (TID) | ORAL | 0 refills | Status: DC | PRN

## 2018-11-29 MED ORDER — ONDANSETRON HCL 4 MG/2ML IJ SOLN
4.0000 mg | Freq: Once | INTRAMUSCULAR | Status: AC
  Administered 2018-11-29: 4 mg via INTRAVENOUS
  Filled 2018-11-29: qty 2

## 2018-11-29 MED ORDER — OMEPRAZOLE 20 MG PO CPDR: 20.0000 mg | DELAYED_RELEASE_CAPSULE | Freq: Every day | ORAL | 0 refills | Status: AC

## 2018-11-29 NOTE — ED Triage Notes (Signed)
Pt. Stated, I started to throw up last night, that's sorted left but still Nausea and stomach pain.

## 2018-11-29 NOTE — ED Provider Notes (Signed)
[facility] EMERGENCY DEPARTMENT Provider Note   CSN: [id number] Arrival date & time: 11/29/18  0841    History   Chief Complaint Chief Complaint  Patient presents with  . Nausea  . Emesis  . Abdominal Pain    HPI Laurie Reid is a 28 y.o. female.     Pt presents to the ED today with n/v and abdominal pain.  Sx started last night.  She denies f/c.  No sob.  She said this feels similar to when she was here in April for n/v.  HIDA scan done then had to be stopped early b/c pt had to leave, but the initial part did not look abnormal.      History reviewed. No pertinent past medical history.  There are no active problems to display for this patient.   Past Surgical History:  Procedure Laterality Date  . KNEE SURGERY Right 2009     OB History   No obstetric history on file.      Home Medications    Prior to Admission medications   Medication Sig Start Date End Date Taking? Authorizing Provider  acetaminophen (TYLENOL) 500 MG tablet Take 1,000 mg by mouth every 6 (six) hours as needed for mild pain or headache.    Yes [provider]  omeprazole (PRILOSEC) 20 MG capsule Take 1 capsule (20 mg total) by mouth daily. 11/29/18   Isla Pence, MD  ondansetron (ZOFRAN ODT) 4 MG disintegrating tablet Take 1 tablet (4 mg total) by mouth every 8 (eight) hours as needed. 11/29/18   Isla Pence, MD  promethazine (PHENERGAN) 12.5 MG tablet Take 1 tablet (12.5 mg total) by mouth every 6 (six) hours as needed for nausea or vomiting. Patient not taking: Reported on 11/29/2018 09/07/18   Petrucelli, Glynda Jaeger, PA-C  valACYclovir (VALTREX) 1000 MG tablet Take 1 tablet (1,000 mg total) by mouth 2 (two) times daily. Patient not taking: Reported on 09/07/2018 06/18/16   Ashley Murrain, NP    Family History No family history on file.  Social History Social History   Tobacco Use  . Smoking status: Never Smoker  . Smokeless tobacco: Never Used   Substance Use Topics  . Alcohol use: Yes  . Drug use: No     Allergies   Patient has no known allergies.   Review of Systems Review of Systems  Gastrointestinal: Positive for abdominal pain, nausea and vomiting.  All other systems reviewed and are negative.    Physical Exam Updated Vital Signs BP 120/83   Pulse 67   Temp 98.6 F (37 C) (Oral)   Resp 16   LMP 11/27/2018   SpO2 97%   Physical Exam Vitals signs and nursing note reviewed.  Constitutional:      Appearance: She is well-developed.  HENT:     Head: Normocephalic and atraumatic.     Mouth/Throat:     Mouth: Mucous membranes are moist.     Pharynx: Oropharynx is clear.  Eyes:     Extraocular Movements: Extraocular movements intact.     Pupils: Pupils are equal, round, and reactive to light.  Cardiovascular:     Rate and Rhythm: Normal rate and regular rhythm.  Pulmonary:     Effort: Pulmonary effort is normal.     Breath sounds: Normal breath sounds.  Abdominal:     General: Abdomen is flat and scaphoid. Bowel sounds are increased.     Palpations: Abdomen is soft.     Tenderness: There is  abdominal tenderness in the epigastric area.  Skin:    General: Skin is warm.     Capillary Refill: Capillary refill takes less than 2 seconds.  Neurological:     General: No focal deficit present.     Mental Status: She is alert and oriented to person, place, and time.  Psychiatric:        Mood and Affect: Mood normal.        Behavior: Behavior normal.      ED Treatments / Results  Labs (all labs ordered are listed, but only abnormal results are displayed) Labs Reviewed  COMPREHENSIVE METABOLIC PANEL - Abnormal; Notable for the following components:      Result Value   Potassium 3.4 (*)    CO2 20 (*)    BUN 5 (*)    Calcium 8.8 (*)    All other components within normal limits  URINALYSIS, ROUTINE W REFLEX MICROSCOPIC - Abnormal; Notable for the following components:   Color, Urine STRAW (*)    Hgb  urine dipstick MODERATE (*)    Ketones, ur 20 (*)    All other components within normal limits  CBC WITH DIFFERENTIAL/PLATELET  LIPASE, BLOOD  I-STAT BETA HCG BLOOD, ED ([facility], [facility], [facility] ONLY)    EKG None  Radiology No results found.  Procedures Procedures (including critical care time)  Medications Ordered in ED Medications  ondansetron (ZOFRAN) injection 4 mg (4 mg Intravenous Given 11/29/18 0943)  morphine 4 MG/ML injection 4 mg (4 mg Intravenous Given 11/29/18 0943)  sodium chloride 0.9 % bolus 1,000 mL (0 mLs Intravenous Stopped 11/29/18 1115)     Initial Impression / Assessment and Plan / ED Course  I have reviewed the triage vital signs and the nursing notes.  Pertinent labs & imaging results that were available during my care of the patient were reviewed by me and considered in my medical decision making (see chart for details).     Pt feels much better after the above treatment.  The pt is able to tolerate po fluids.  Labs reviewed and are nl other than dehydration.  Pt is stable for d/c.  Return if worse.  Final Clinical Impressions(s) / ED Diagnoses   Final diagnoses:  Dehydration  Non-intractable vomiting with nausea, unspecified vomiting type    ED Discharge Orders         Ordered    omeprazole (PRILOSEC) 20 MG capsule  Daily     11/29/18 1238    ondansetron (ZOFRAN ODT) 4 MG disintegrating tablet  Every 8 hours PRN     11/29/18 1238           Jacalyn LefevreHaviland, Aldora Perman, MD 11/29/18 1240

## 2020-01-25 ENCOUNTER — Ambulatory Visit (HOSPITAL_COMMUNITY)
Admission: EM | Admit: 2020-01-25 | Discharge: 2020-01-25 | Disposition: A | Discharge status: Home / Self Care | Payer: 59 | Attending: Internal Medicine | Admitting: Internal Medicine

## 2020-01-25 ENCOUNTER — Other Ambulatory Visit: Payer: Self-pay

## 2020-01-25 DIAGNOSIS — Z203 Contact with and (suspected) exposure to rabies: Secondary | ICD-10-CM

## 2020-01-25 MED ORDER — RABIES VACCINE, PCEC IM SUSR
1.0000 mL | Freq: Once | INTRAMUSCULAR | Status: AC
  Administered 2020-01-25: 1 mL via INTRAMUSCULAR

## 2020-01-25 MED ORDER — RABIES VACCINE, PCEC IM SUSR
INTRAMUSCULAR | Status: AC
  Filled 2020-01-25: qty 1

## 2020-01-25 NOTE — ED Triage Notes (Signed)
Pt presents for second rabies injection  

## 2020-08-22 ENCOUNTER — Emergency Department (HOSPITAL_COMMUNITY)
Admission: EM | Admit: 2020-08-22 | Discharge: 2020-08-22 | Disposition: A | Discharge status: Home / Self Care | Payer: 59 | Attending: Emergency Medicine | Admitting: Emergency Medicine

## 2020-08-22 ENCOUNTER — Encounter (HOSPITAL_COMMUNITY): Payer: Self-pay | Admitting: *Deleted

## 2020-08-22 ENCOUNTER — Other Ambulatory Visit: Payer: Self-pay

## 2020-08-22 DIAGNOSIS — R1084 Generalized abdominal pain: Secondary | ICD-10-CM | POA: Diagnosis present

## 2020-08-22 DIAGNOSIS — R197 Diarrhea, unspecified: Secondary | ICD-10-CM | POA: Insufficient documentation

## 2020-08-22 DIAGNOSIS — R112 Nausea with vomiting, unspecified: Secondary | ICD-10-CM | POA: Diagnosis not present

## 2020-08-22 LAB — CBC WITH DIFFERENTIAL/PLATELET
Abs Immature Granulocytes: 0.03 10*3/uL (ref 0.00–0.07)
Basophils Absolute: 0 10*3/uL (ref 0.0–0.1)
Basophils Relative: 0 %
Eosinophils Absolute: 0.1 10*3/uL (ref 0.0–0.5)
Eosinophils Relative: 1 %
HCT: 41.5 % (ref 36.0–46.0)
Hemoglobin: 14.2 g/dL (ref 12.0–15.0)
Immature Granulocytes: 0 %
Lymphocytes Relative: 6 %
Lymphs Abs: 0.6 10*3/uL — ABNORMAL LOW (ref 0.7–4.0)
MCH: 29.7 pg (ref 26.0–34.0)
MCHC: 34.2 g/dL (ref 30.0–36.0)
MCV: 86.8 fL (ref 80.0–100.0)
Monocytes Absolute: 0.6 10*3/uL (ref 0.1–1.0)
Monocytes Relative: 5 %
Neutro Abs: 9.2 10*3/uL — ABNORMAL HIGH (ref 1.7–7.7)
Neutrophils Relative %: 88 %
Platelets: 284 10*3/uL (ref 150–400)
RBC: 4.78 MIL/uL (ref 3.87–5.11)
RDW: 13.2 % (ref 11.5–15.5)
WBC: 10.5 10*3/uL (ref 4.0–10.5)
nRBC: 0 % (ref 0.0–0.2)

## 2020-08-22 LAB — COMPREHENSIVE METABOLIC PANEL
ALT: 58 U/L — ABNORMAL HIGH (ref 0–44)
AST: 40 U/L (ref 15–41)
Albumin: 4.5 g/dL (ref 3.5–5.0)
Alkaline Phosphatase: 61 U/L (ref 38–126)
Anion gap: 7 (ref 5–15)
BUN: 7 mg/dL (ref 6–20)
CO2: 24 mmol/L (ref 22–32)
Calcium: 9.3 mg/dL (ref 8.9–10.3)
Chloride: 108 mmol/L (ref 98–111)
Creatinine, Ser: 0.7 mg/dL (ref 0.44–1.00)
GFR, Estimated: >60 mL/min (ref >60)
Glucose, Bld: 100 mg/dL — ABNORMAL HIGH (ref 70–99)
Potassium: 4.1 mmol/L (ref 3.5–5.1)
Sodium: 139 mmol/L (ref 135–145)
Total Bilirubin: 0.9 mg/dL (ref 0.3–1.2)
Total Protein: 7.3 g/dL (ref 6.5–8.1)

## 2020-08-22 LAB — I-STAT BETA HCG BLOOD, ED (MC, WL, AP ONLY): I-stat hCG, quantitative: <5 m[IU]/mL (ref <5)

## 2020-08-22 LAB — LIPASE, BLOOD: Lipase: 34 U/L (ref 11–51)

## 2020-08-22 MED ORDER — ONDANSETRON HCL 4 MG/2ML IJ SOLN
4.0000 mg | Freq: Once | INTRAMUSCULAR | Status: AC
  Administered 2020-08-22: 4 mg via INTRAVENOUS
  Filled 2020-08-22: qty 2

## 2020-08-22 MED ORDER — KETOROLAC TROMETHAMINE 30 MG/ML IJ SOLN
30.0000 mg | Freq: Once | INTRAMUSCULAR | Status: AC
  Administered 2020-08-22: 30 mg via INTRAVENOUS
  Filled 2020-08-22: qty 1

## 2020-08-22 MED ORDER — ONDANSETRON 4 MG PO TBDP: 4.0000 mg | ORAL_TABLET | Freq: Three times a day (TID) | ORAL | 0 refills | Status: DC | PRN

## 2020-08-22 MED ORDER — SODIUM CHLORIDE 0.9 % IV SOLN
12.5000 mg | Freq: Once | INTRAVENOUS | Status: AC
  Administered 2020-08-22: 12.5 mg via INTRAVENOUS
  Filled 2020-08-22: qty 0.5

## 2020-08-22 MED ORDER — LOPERAMIDE HCL 2 MG PO CAPS: 2.0000 mg | ORAL_CAPSULE | Freq: Four times a day (QID) | ORAL | 0 refills | Status: AC | PRN

## 2020-08-22 MED ORDER — DICYCLOMINE HCL 10 MG PO CAPS: 10.0000 mg | ORAL_CAPSULE | Freq: Three times a day (TID) | ORAL | 0 refills | Status: AC

## 2020-08-22 MED ORDER — SODIUM CHLORIDE 0.9 % IV BOLUS
1000.0000 mL | Freq: Once | INTRAVENOUS | Status: AC
  Administered 2020-08-22: 1000 mL via INTRAVENOUS

## 2020-08-22 NOTE — ED Notes (Signed)
Patient states she is feeling better.

## 2020-08-22 NOTE — ED Notes (Signed)
Went to bathroom attempted to get urine however only had diarrhea

## 2020-08-22 NOTE — ED Notes (Signed)
Aware she needs a urine sample.

## 2020-08-22 NOTE — Discharge Instructions (Signed)
Take Bentyl for pain.  Take Zofran for nausea/vomiting.  Take Immodium for diarrhea.   Make sure you are staying hydrated drink plenty of fluids.  As we discussed, you can use things like bananas, rice, applesauce, toast for the next few days help settle your stomach.  Return to the Emergency Department immediately if you experience any worsening abdominal pain, fever, persistent nausea and vomiting, inability keep any food down, pain with urination, blood in your urine or any other worsening or concerning symptoms.

## 2020-08-22 NOTE — ED Provider Notes (Signed)
[facility] EMERGENCY DEPARTMENT Provider Note   CSN: [id number] Arrival date & time: 08/22/20  1238     History Chief Complaint  Patient presents with  . Abdominal Pain  . Emesis  . Diarrhea    Laurie Reid is a 30 y.o. female who presents for evaluation of generalized abdominal pain, nausea/vomiting/diarrhea that began about 6:30 AM.  She reports at 4:30 AM, they ate street tacos from a food truck.  She reports that about 2 hours later, she started developing nausea/vomiting and then proceeded to have episodes of diarrhea.  No blood noted in stools or emesis.  He states her abdomen started hurting after vomiting.  States it is generalized pain but no focal tenderness.  She has not noted any fever.  She reports that her husband ate similar food last night and did have some diarrhea but she states sometimes that is typical for him.  She states she was in her normal state of health prior to onset of symptoms.  Denies any dysuria, hematuria, vaginal bleeding, vaginal discharge, chest pain, difficulty breathing.  The history is provided by the patient.       History reviewed. No pertinent past medical history.  There are no problems to display for this patient.   Past Surgical History:  Procedure Laterality Date  . KNEE SURGERY Right 2009     OB History   No obstetric history on file.     No family history on file.  Social History   Tobacco Use  . Smoking status: Never Smoker  . Smokeless tobacco: Never Used  Substance Use Topics  . Alcohol use: Not Currently  . Drug use: No    Home Medications Prior to Admission medications   Medication Sig Start Date End Date Taking? Authorizing Provider  dicyclomine (BENTYL) 10 MG capsule Take 1 capsule (10 mg total) by mouth 4 (four) times daily -  before meals and at bedtime. 08/22/20  Yes Maxwell Caul, PA-C  loperamide (IMODIUM) 2 MG capsule Take 1 capsule (2 mg total) by mouth 4 (four) times daily as  needed for diarrhea or loose stools. 08/22/20  Yes Graciella Freer A, PA-C  ondansetron (ZOFRAN ODT) 4 MG disintegrating tablet Take 1 tablet (4 mg total) by mouth every 8 (eight) hours as needed for nausea or vomiting. 08/22/20  Yes Maxwell Caul, PA-C  acetaminophen (TYLENOL) 500 MG tablet Take 1,000 mg by mouth every 6 (six) hours as needed for mild pain or headache.     [provider]  omeprazole (PRILOSEC) 20 MG capsule Take 1 capsule (20 mg total) by mouth daily. 11/29/18   Jacalyn Lefevre, MD  promethazine (PHENERGAN) 12.5 MG tablet Take 1 tablet (12.5 mg total) by mouth every 6 (six) hours as needed for nausea or vomiting. Patient not taking: Reported on 11/29/2018 09/07/18   Petrucelli, Pleas Koch, PA-C  valACYclovir (VALTREX) 1000 MG tablet Take 1 tablet (1,000 mg total) by mouth 2 (two) times daily. Patient not taking: Reported on 09/07/2018 06/18/16   [name], NP    Allergies    Patient has no known allergies.  Review of Systems   Review of Systems  Respiratory: Negative for shortness of breath.   Cardiovascular: Negative for chest pain.  Gastrointestinal: Positive for nausea.  Genitourinary: Negative for dysuria and hematuria.  All other systems reviewed and are negative.   Physical Exam Updated Vital Signs BP (!) 108/54 (BP Location: Left Arm)   Pulse 64   Temp  98.6 F (37 C) (Oral)   Resp 14   Ht 5\' 6"  (1.676 m)   Wt 77.1 kg   SpO2 98%   BMI 27.44 kg/m   Physical Exam Vitals and nursing note reviewed.  Constitutional:      Appearance: Normal appearance. She is well-developed.  HENT:     Head: Normocephalic and atraumatic.  Eyes:     General: Lids are normal.     Conjunctiva/sclera: Conjunctivae normal.     Pupils: Pupils are equal, round, and reactive to light.  Cardiovascular:     Rate and Rhythm: Normal rate and regular rhythm.     Pulses: Normal pulses.     Heart sounds: Normal heart sounds. No murmur heard. No friction rub. No gallop.    Pulmonary:     Effort: Pulmonary effort is normal.     Breath sounds: Normal breath sounds.     Comments: Lungs clear to auscultation bilaterally.  Symmetric chest rise.  No wheezing, rales, rhonchi. Abdominal:     Palpations: Abdomen is soft. Abdomen is not rigid.     Tenderness: There is generalized abdominal tenderness. There is no guarding.     Comments: Abdomen soft, nondistended.  Generalized tenderness no focal point.  No rigidity, guarding.  Musculoskeletal:        General: Normal range of motion.     Cervical back: Full passive range of motion without pain.  Skin:    General: Skin is warm and dry.     Capillary Refill: Capillary refill takes less than 2 seconds.  Neurological:     Mental Status: She is alert and oriented to person, place, and time.  Psychiatric:        Speech: Speech normal.     ED Results / Procedures / Treatments   Labs (all labs ordered are listed, but only abnormal results are displayed) Labs Reviewed  CBC WITH DIFFERENTIAL/PLATELET - Abnormal; Notable for the following components:      Result Value   Neutro Abs 9.2 (*)    Lymphs Abs 0.6 (*)    All other components within normal limits  COMPREHENSIVE METABOLIC PANEL - Abnormal; Notable for the following components:   Glucose, Bld 100 (*)    ALT 58 (*)    All other components within normal limits  LIPASE, BLOOD  I-STAT BETA HCG BLOOD, ED ([facility], [facility], [facility] ONLY)    EKG None  Radiology No results found.  Procedures Procedures   Medications Ordered in ED Medications  sodium chloride 0.9 % bolus 1,000 mL (0 mLs Intravenous Stopped 08/22/20 1438)  ondansetron (ZOFRAN) injection 4 mg (4 mg Intravenous Given 08/22/20 1318)  ketorolac (TORADOL) 30 MG/ML injection 30 mg (30 mg Intravenous Given 08/22/20 1441)  promethazine (PHENERGAN) 12.5 mg in sodium chloride 0.9 % 50 mL IVPB (0 mg Intravenous Stopped 08/22/20 1711)    ED Course  I have reviewed the triage vital signs and the nursing  notes.  Pertinent labs & imaging results that were available during my care of the patient were reviewed by me and considered in my medical decision making (see chart for details).    MDM Rules/Calculators/A&P                          30 year old female presents for evaluation of generalized abdominal pain, nausea/vomiting/diarrhea that began acutely at 6:30 AM this morning after eating tacos from a food truck at 4:30 AM.  Husband has diarrhea.  On initial  arrival, she is afebrile, toxic appearing.  Vital signs are stable.  On exam, she has generalized abdominal tenderness with no rigidity, guarding.  Suspect viral illness versus foodborne illness.  History/physical exam not concerning for appendicitis, kidney stone, diverticulitis, ovarian torsion.  Plan check labs, give fluids, antiemetics.  I-STAT beta is negative.  Lipase unremarkable.  CBC shows no leukocytosis, anemia.  CMP is unremarkable.  Reevaluation.  Patient resting comfortably.  She did have another episode of diarrhea here in the department.  She reports improvement in pain and nausea.  She still feels tired.  We will plan to p.o. challenge and reevaluate.  Patient able tolerate p.o.  She states that she has still has some mild diffuse abdominal cramping but patient states her pain has improved.  She has not had any vomiting here in the ED.  Patient states she feels good enough to go home.  I discussed at length regarding patient's work-up here today.  Her exam is not consistent with appendicitis.  Additionally, her labs look reassuring.  At this time, do not feel that CT ab pelvis is warranted as do not suspect surgical abdomen.  After discussion with patient, patient in agreement.  Suspect this is most likely foodborne illness versus viral GI process.  History/physical exam not concerning for appendicitis, diverticulitis, ovarian torsion.  We will plan to send her home with symptomatic medication.  Patient encouraged on at home  supportive care measures. At this time, patient exhibits no emergent life-threatening condition that require further evaluation in ED. Patient had ample opportunity for questions and discussion. All patient's questions were answered with full understanding. Strict return precautions discussed. Patient expresses understanding and agreement to plan.   Portions of this note were generated with Scientist, clinical (histocompatibility and immunogenetics). Dictation errors may occur despite best attempts at proofreading.  Final Clinical Impression(s) / ED Diagnoses Final diagnoses:  Generalized abdominal pain  Nausea vomiting and diarrhea    Rx / DC Orders ED Discharge Orders         Ordered    loperamide (IMODIUM) 2 MG capsule  4 times daily PRN        08/22/20 1620    ondansetron (ZOFRAN ODT) 4 MG disintegrating tablet  Every 8 hours PRN        08/22/20 1620    dicyclomine (BENTYL) 10 MG capsule  3 times daily before meals & bedtime        08/22/20 1620           Maxwell Caul, PA-C 08/23/20 1441    Virgina Norfolk, DO 08/23/20 1455

## 2020-08-22 NOTE — ED Notes (Signed)
Family at bedside. 

## 2020-08-22 NOTE — ED Triage Notes (Signed)
Patient states she ate tacos around 430 am states she started vomiting with diarrhea at 630 and she has been v/d appro. 4/hr. C/o abd. Pain rates 9/10

## 2020-11-04 ENCOUNTER — Institutional Professional Consult (permissible substitution): Payer: 59 | Admitting: Plastic Surgery

## 2021-08-02 ENCOUNTER — Other Ambulatory Visit: Payer: Self-pay | Admitting: Obstetrics and Gynecology

## 2021-08-02 DIAGNOSIS — R928 Other abnormal and inconclusive findings on diagnostic imaging of breast: Secondary | ICD-10-CM

## 2021-08-05 ENCOUNTER — Ambulatory Visit
Admission: RE | Admit: 2021-08-05 | Discharge: 2021-08-05 | Disposition: A | Discharge status: Home / Self Care | Payer: BC Managed Care – PPO | Source: Ambulatory Visit | Attending: Obstetrics and Gynecology | Admitting: Obstetrics and Gynecology

## 2021-08-05 ENCOUNTER — Ambulatory Visit
Admission: RE | Admit: 2021-08-05 | Discharge: 2021-08-05 | Disposition: A | Discharge status: Home / Self Care | Payer: Self-pay | Source: Ambulatory Visit | Attending: Obstetrics and Gynecology | Admitting: Obstetrics and Gynecology

## 2021-08-05 ENCOUNTER — Other Ambulatory Visit: Payer: Self-pay | Admitting: Obstetrics and Gynecology

## 2021-08-05 DIAGNOSIS — R928 Other abnormal and inconclusive findings on diagnostic imaging of breast: Secondary | ICD-10-CM

## 2021-08-05 DIAGNOSIS — N6310 Unspecified lump in the right breast, unspecified quadrant: Secondary | ICD-10-CM

## 2022-02-08 ENCOUNTER — Other Ambulatory Visit: Payer: BC Managed Care – PPO

## 2022-02-17 ENCOUNTER — Other Ambulatory Visit: Payer: BC Managed Care – PPO

## 2022-02-27 ENCOUNTER — Other Ambulatory Visit: Payer: BC Managed Care – PPO

## 2022-02-27 ENCOUNTER — Inpatient Hospital Stay: Admission: RE | Admit: 2022-02-27 | Payer: BC Managed Care – PPO | Source: Ambulatory Visit

## 2022-03-21 ENCOUNTER — Ambulatory Visit
Admission: RE | Admit: 2022-03-21 | Discharge: 2022-03-21 | Disposition: A | Discharge status: Home / Self Care | Payer: BC Managed Care – PPO | Source: Ambulatory Visit | Attending: Obstetrics and Gynecology | Admitting: Obstetrics and Gynecology

## 2022-03-21 DIAGNOSIS — N6310 Unspecified lump in the right breast, unspecified quadrant: Secondary | ICD-10-CM

## 2022-03-23 ENCOUNTER — Other Ambulatory Visit: Payer: Self-pay | Admitting: Obstetrics and Gynecology

## 2022-03-23 DIAGNOSIS — N6310 Unspecified lump in the right breast, unspecified quadrant: Secondary | ICD-10-CM

## 2022-05-12 ENCOUNTER — Emergency Department (HOSPITAL_COMMUNITY)
Admission: EM | Admit: 2022-05-12 | Discharge: 2022-05-12 | Disposition: A | Discharge status: Home / Self Care | Payer: BC Managed Care – PPO | Attending: Emergency Medicine | Admitting: Emergency Medicine

## 2022-05-12 ENCOUNTER — Encounter (HOSPITAL_COMMUNITY): Payer: Self-pay

## 2022-05-12 ENCOUNTER — Other Ambulatory Visit: Payer: Self-pay

## 2022-05-12 DIAGNOSIS — J3489 Other specified disorders of nose and nasal sinuses: Secondary | ICD-10-CM | POA: Diagnosis not present

## 2022-05-12 DIAGNOSIS — H1032 Unspecified acute conjunctivitis, left eye: Secondary | ICD-10-CM | POA: Diagnosis not present

## 2022-05-12 DIAGNOSIS — H579 Unspecified disorder of eye and adnexa: Secondary | ICD-10-CM | POA: Diagnosis present

## 2022-05-12 MED ORDER — LEVOFLOXACIN 0.5 % OP SOLN: OPHTHALMIC | 0 refills | Status: AC

## 2022-05-12 NOTE — Discharge Instructions (Addendum)
Thank you for allowing me to be part of your care today.  I have sent over a prescription for eyedrops to use over the next 7 days, please follow the instructions as prescribed.  If your symptoms do not begin to improve with treatment, I recommend following up with your primary care physician.  Be sure to discard your current pair of contact lenses and do not wear contact lenses for the duration of treatment.  I also recommend cleaning any pillowcases, sheets, towels, washcloths etc. that have come into contact with your face or eye.  Please refrain from touching your face and practice good hand hygiene.

## 2022-05-12 NOTE — ED Triage Notes (Signed)
Pt arrived POV from home stating she woke up with pink eye. Pt's eyes are red, itchy and watery.

## 2022-05-12 NOTE — ED Provider Notes (Signed)
[facility] EMERGENCY DEPARTMENT Provider Note   CSN: [id number] Arrival date & time: 05/12/22  1126     History  Chief Complaint  Patient presents with   Conjunctivitis    Laurie Reid is a 31 y.o. female presents to the ED complaining of redness and discharge to her left eye.  Patient states that she noticed it yesterday, but woke up this morning with yellow crusting to her eyelid and required a warm compress to get it open.  She states the discharge is mainly watery and her eyes burning and itching.  She does wear contact lenses, but has not worn any today.  Denies fever, chills, changes in vision, purulent eye discharge.  She states she has had a recent upper respiratory infection as well with congestion and rhinorrhea.  Denies any eye trauma.       Home Medications Prior to Admission medications   Medication Sig Start Date End Date Taking? Authorizing Provider  levofloxacin Charlean Sanfilippo) 0.5 % ophthalmic solution Place 1 drop into the left eye every 2 (two) hours for 2 days, THEN 1 drop every 6 (six) hours for 5 days. 05/12/22 05/19/22 Yes Marijah Larranaga R, PA  acetaminophen (TYLENOL) 500 MG tablet Take 1,000 mg by mouth every 6 (six) hours as needed for mild pain or headache.     [provider]  dicyclomine (BENTYL) 10 MG capsule Take 1 capsule (10 mg total) by mouth 4 (four) times daily -  before meals and at bedtime. 08/22/20   Maxwell Caul, PA-C  loperamide (IMODIUM) 2 MG capsule Take 1 capsule (2 mg total) by mouth 4 (four) times daily as needed for diarrhea or loose stools. 08/22/20   Maxwell Caul, PA-C  omeprazole (PRILOSEC) 20 MG capsule Take 1 capsule (20 mg total) by mouth daily. 11/29/18   Jacalyn Lefevre, MD  ondansetron (ZOFRAN ODT) 4 MG disintegrating tablet Take 1 tablet (4 mg total) by mouth every 8 (eight) hours as needed for nausea or vomiting. 08/22/20   Maxwell Caul, PA-C  promethazine (PHENERGAN) 12.5 MG tablet Take 1 tablet  (12.5 mg total) by mouth every 6 (six) hours as needed for nausea or vomiting. Patient not taking: Reported on 11/29/2018 09/07/18   Petrucelli, Pleas Koch, PA-C  valACYclovir (VALTREX) 1000 MG tablet Take 1 tablet (1,000 mg total) by mouth 2 (two) times daily. Patient not taking: Reported on 09/07/2018 06/18/16   [name], NP      Allergies    Patient has no known allergies.    Review of Systems   Review of Systems  Constitutional:  Negative for chills and fever.  HENT:  Positive for congestion and rhinorrhea.   Eyes:  Positive for pain, discharge, redness and itching. Negative for photophobia and visual disturbance.    Physical Exam Updated Vital Signs BP 121/78 (BP Location: Right Arm)   Pulse 82   Temp 98.6 F (37 C) (Oral)   Resp 14   Ht 5\' 6"  (1.676 m)   Wt 78.9 kg   SpO2 98%   BMI 28.08 kg/m  Physical Exam Vitals and nursing note reviewed.  Constitutional:      General: She is not in acute distress.    Appearance: Normal appearance. She is not ill-appearing or diaphoretic.  HENT:     Nose: Congestion and rhinorrhea present. Rhinorrhea is clear.  Eyes:     General: Lids are normal.     Extraocular Movements: Extraocular movements intact.  Conjunctiva/sclera:     Left eye: Left conjunctiva is injected. No exudate.    Pupils: Pupils are equal, round, and reactive to light.     Comments: Injected sclera to the left eye with watery discharge  Cardiovascular:     Rate and Rhythm: Normal rate and regular rhythm.  Pulmonary:     Effort: Pulmonary effort is normal.  Neurological:     Mental Status: She is alert. Mental status is at baseline.  Psychiatric:        Mood and Affect: Mood normal.        Behavior: Behavior normal.     ED Results / Procedures / Treatments   Labs (all labs ordered are listed, but only abnormal results are displayed) Labs Reviewed - No data to display  EKG None  Radiology No results found.  Procedures Procedures     Medications Ordered in ED Medications - No data to display  ED Course/ Medical Decision Making/ A&P                           Medical Decision Making  Patient presents to the ED with concerns of conjunctivitis in her left eye.  She has had a prodrome of upper respiratory type infection symptoms.  She states her eye had severe watery discharge and crusting this morning requiring warm compresses to be able to open it.  She is a contact lens wearer.  Denies any changes to her vision, fever, purulent discharge from the eye.  Differential diagnosis include viral conjunctivitis, bacterial conjunctivitis, episcleritis, anterior uveitis.  Given patient's symptoms and physical exam, I suspect this is likely a viral conjunctivitis, however, given that patient wears contact lenses will prescribe drops to cover Pseudomonas for potentially bacterial conjunctivitis.  Discussed with patient use of eyedrops as well as not wearing contacts until she is asymptomatic and discarding current pair of contacts.    The patient has been appropriately medically screened and/or stabilized in the ED. I have low suspicion for any other emergent medical condition which would require further screening, evaluation or treatment in the ED or require inpatient management. At time of discharge the patient is hemodynamically stable and in no acute distress. I have discussed work-up results and diagnosis with patient and answered all questions. Patient is agreeable with discharge plan. We discussed strict return precautions for returning to the emergency department and they verbalized understanding.  Will send prescription for levofloxacin ophthalmic solution for patient to use over the next 7 days.  Recommended follow-up with primary care physician if her symptoms do not begin to improve with treatment.         Final Clinical Impression(s) / ED Diagnoses Final diagnoses:  Acute conjunctivitis of left eye, unspecified acute  conjunctivitis type    Rx / DC Orders ED Discharge Orders          Ordered    levofloxacin Charlean Sanfilippo) 0.5 % ophthalmic solution  Multiple Frequencies        05/12/22 1244              Chestine Spore Morse, [place] 05/12/22 1246    Loetta Rough, MD 05/12/22 1323

## 2022-05-19 ENCOUNTER — Other Ambulatory Visit: Payer: Self-pay

## 2022-05-19 ENCOUNTER — Emergency Department (HOSPITAL_COMMUNITY)
Admission: EM | Admit: 2022-05-19 | Discharge: 2022-05-19 | Disposition: A | Discharge status: Home / Self Care | Payer: BC Managed Care – PPO | Attending: Emergency Medicine | Admitting: Emergency Medicine

## 2022-05-19 DIAGNOSIS — Z20822 Contact with and (suspected) exposure to covid-19: Secondary | ICD-10-CM | POA: Diagnosis not present

## 2022-05-19 DIAGNOSIS — J101 Influenza due to other identified influenza virus with other respiratory manifestations: Secondary | ICD-10-CM | POA: Insufficient documentation

## 2022-05-19 DIAGNOSIS — R509 Fever, unspecified: Secondary | ICD-10-CM | POA: Diagnosis present

## 2022-05-19 DIAGNOSIS — R000 Tachycardia, unspecified: Secondary | ICD-10-CM | POA: Diagnosis not present

## 2022-05-19 LAB — RESP PANEL BY RT-PCR (RSV, FLU A&B, COVID)  RVPGX2
Influenza A by PCR: POSITIVE — AB
Influenza B by PCR: NEGATIVE
Resp Syncytial Virus by PCR: NEGATIVE
SARS Coronavirus 2 by RT PCR: NEGATIVE

## 2022-05-19 MED ORDER — ACETAMINOPHEN 500 MG PO TABS
1000.0000 mg | ORAL_TABLET | Freq: Once | ORAL | Status: AC
  Administered 2022-05-19: 1000 mg via ORAL
  Filled 2022-05-19: qty 2

## 2022-05-19 NOTE — ED Provider Triage Note (Signed)
Emergency Medicine Provider Triage Evaluation Note  Laurie Reid , a 31 y.o. female  was evaluated in triage.  Pt complains of cold sxs. Report fever, chills, body aches, sore throat, cough, congestion x 3 days.  Tried theraflu last night.  No recent sick contact.  No n/v/d, sob, dysuria  Review of Systems  Positive: As above Negative: As above  Physical Exam  BP 128/84 (BP Location: Right Arm)   Pulse (!) 128   Temp (!) 101.9 F (38.8 C) (Oral)   Resp 18   Ht 5\' 6"  (1.676 m)   Wt 83.9 kg   LMP 04/22/2022   SpO2 96%   BMI 29.86 kg/m  Gen:   Awake, no distress   Resp:  Normal effort  MSK:   Moves extremities without difficulty  Other:    Medical Decision Making  Medically screening exam initiated at 9:29 AM.  Appropriate orders placed.  Laurie Reid was informed that the remainder of the evaluation will be completed by another provider, this initial triage assessment does not replace that evaluation, and the importance of remaining in the ED until their evaluation is complete.  Febrile and tachycardia, tylenol given.  Viral resp panel orderded   Laurie Ferrari, PA-C 05/19/22 [phone]

## 2022-05-19 NOTE — ED Provider Notes (Signed)
[facility] EMERGENCY DEPARTMENT Provider Note   CSN: [id number] Arrival date & time: 05/19/22  5361     History  Chief Complaint  Patient presents with   bodyaches   Cough   Fever   Chest Pain   Sore Throat    Laurie Reid is a 31 y.o. female.  HPI 31 year old female with no significant past medical history presents with fever.  2 days ago woke up with a sore throat.  She is also been having cough and some chest soreness when coughing.  Some dyspnea.  She is also having bodyaches.  Today she woke up with a fever.  She was given Tylenol in triage and is feeling a lot better currently.  Does not currently feel short of breath.  No vomiting.  Home Medications Prior to Admission medications   Medication Sig Start Date End Date Taking? Authorizing Provider  acetaminophen (TYLENOL) 500 MG tablet Take 1,000 mg by mouth every 6 (six) hours as needed for mild pain or headache.     [provider]  dicyclomine (BENTYL) 10 MG capsule Take 1 capsule (10 mg total) by mouth 4 (four) times daily -  before meals and at bedtime. 08/22/20   Maxwell Caul, PA-C  levofloxacin Charlean Sanfilippo) 0.5 % ophthalmic solution Place 1 drop into the left eye every 2 (two) hours for 2 days, THEN 1 drop every 6 (six) hours for 5 days. 05/12/22 05/19/22  Melton Alar R, PA  loperamide (IMODIUM) 2 MG capsule Take 1 capsule (2 mg total) by mouth 4 (four) times daily as needed for diarrhea or loose stools. 08/22/20   Maxwell Caul, PA-C  omeprazole (PRILOSEC) 20 MG capsule Take 1 capsule (20 mg total) by mouth daily. 11/29/18   Jacalyn Lefevre, MD  ondansetron (ZOFRAN ODT) 4 MG disintegrating tablet Take 1 tablet (4 mg total) by mouth every 8 (eight) hours as needed for nausea or vomiting. 08/22/20   Maxwell Caul, PA-C  promethazine (PHENERGAN) 12.5 MG tablet Take 1 tablet (12.5 mg total) by mouth every 6 (six) hours as needed for nausea or vomiting. Patient not taking: Reported on  11/29/2018 09/07/18   Petrucelli, Pleas Koch, PA-C  valACYclovir (VALTREX) 1000 MG tablet Take 1 tablet (1,000 mg total) by mouth 2 (two) times daily. Patient not taking: Reported on 09/07/2018 06/18/16   [name], NP      Allergies    Patient has no known allergies.    Review of Systems   Review of Systems  Constitutional:  Positive for fever.  HENT:  Positive for congestion and sore throat.   Respiratory:  Positive for cough and shortness of breath.   Gastrointestinal:  Negative for vomiting.  Neurological:  Negative for headaches.    Physical Exam Updated Vital Signs BP 118/80 (BP Location: Right Arm)   Pulse (!) 104   Temp 98 F (36.7 C) (Temporal)   Resp 16   Ht 5\' 6"  (1.676 m)   Wt 83.9 kg   LMP 04/22/2022   SpO2 98%   BMI 29.86 kg/m  Physical Exam Vitals and nursing note reviewed.  Constitutional:      Appearance: She is well-developed.  HENT:     Head: Normocephalic and atraumatic.  Cardiovascular:     Rate and Rhythm: Regular rhythm. Tachycardia present.     Heart sounds: Normal heart sounds.     Comments: HR~105 Pulmonary:     Effort: Pulmonary effort is normal.  Breath sounds: Normal breath sounds.  Abdominal:     Palpations: Abdomen is soft.     Tenderness: There is no abdominal tenderness.  Skin:    General: Skin is warm and dry.  Neurological:     Mental Status: She is alert.     ED Results / Procedures / Treatments   Labs (all labs ordered are listed, but only abnormal results are displayed) Labs Reviewed  RESP PANEL BY RT-PCR (RSV, FLU A&B, COVID)  RVPGX2 - Abnormal; Notable for the following components:      Result Value   Influenza A by PCR POSITIVE (*)    All other components within normal limits    EKG None  Radiology No results found.  Procedures Procedures    Medications Ordered in ED Medications  acetaminophen (TYLENOL) tablet 1,000 mg (1,000 mg Oral Given 05/19/22 0934)    ED Course/ Medical Decision Making/  A&P                           Medical Decision Making Amount and/or Complexity of Data Reviewed Labs:     Details: Flu A test is positive.   Patient's heart rate is improved with Tylenol.  She was encouraged to drink oral fluids in the emergency department.  She feels like there is some chest discomfort and congestion from coughing.  However her lungs are clear on exam and she has normal work of breathing and oxygenation.  We decided to hold off on x-ray given that her flu test is positive and this would explain all of her symptoms.  She has no significant past medical history and her symptoms are now over 67 hours old so I do not think Tamiflu is warranted.  We discussed this as well as how antibiotics will be ineffective.  We discussed supportive care and over-the-counter options.  She will be given return precautions but at this point she appears stable for discharge home.        Final Clinical Impression(s) / ED Diagnoses Final diagnoses:  Influenza A    Rx / DC Orders ED Discharge Orders     None         Pricilla Loveless, MD 05/19/22 1429

## 2022-05-19 NOTE — ED Triage Notes (Signed)
Pt. Stated, I started having burning in my throat, body aches, cough started on Wednesday.

## 2022-05-19 NOTE — ED Notes (Signed)
RN called lab to ensure resp swab was received

## 2022-05-19 NOTE — Discharge Instructions (Addendum)
Your Flu test is positive today. Alternate between Ibuprofen and Tylenol for pain, fever, body aches, etc. Be sure to drink plenty of fluids. You can take over the counter meds for cough such as DayQuil, but be aware these can sometimes have Acetaminophen/Tylenol in it, so don't more than 4000 mg of combined Tylenol per day.  If the fever doesn't go away, or if you develop trouble breathing, vomiting, or any other new/concerning symptoms then return to the ER for evaluation.

## 2022-05-19 NOTE — ED Notes (Signed)
Covid/flu swab sent to lab.

## 2022-05-22 ENCOUNTER — Emergency Department (HOSPITAL_COMMUNITY)
Admission: EM | Admit: 2022-05-22 | Discharge: 2022-05-22 | Discharge status: Against Medical Advice | Payer: BC Managed Care – PPO | Attending: Emergency Medicine | Admitting: Emergency Medicine

## 2022-05-22 ENCOUNTER — Other Ambulatory Visit: Payer: Self-pay

## 2022-05-22 DIAGNOSIS — R1110 Vomiting, unspecified: Secondary | ICD-10-CM | POA: Diagnosis not present

## 2022-05-22 DIAGNOSIS — R1012 Left upper quadrant pain: Secondary | ICD-10-CM | POA: Diagnosis present

## 2022-05-22 DIAGNOSIS — Z5321 Procedure and treatment not carried out due to patient leaving prior to being seen by health care provider: Secondary | ICD-10-CM | POA: Diagnosis not present

## 2022-05-22 LAB — COMPREHENSIVE METABOLIC PANEL
ALT: 129 U/L — ABNORMAL HIGH (ref 0–44)
AST: 71 U/L — ABNORMAL HIGH (ref 15–41)
Albumin: 4.4 g/dL (ref 3.5–5.0)
Alkaline Phosphatase: 86 U/L (ref 38–126)
Anion gap: 12 (ref 5–15)
BUN: 7 mg/dL (ref 6–20)
CO2: 22 mmol/L (ref 22–32)
Calcium: 9.4 mg/dL (ref 8.9–10.3)
Chloride: 102 mmol/L (ref 98–111)
Creatinine, Ser: 0.7 mg/dL (ref 0.44–1.00)
GFR, Estimated: >60 mL/min (ref >60)
Glucose, Bld: 114 mg/dL — ABNORMAL HIGH (ref 70–99)
Potassium: 4.1 mmol/L (ref 3.5–5.1)
Sodium: 136 mmol/L (ref 135–145)
Total Bilirubin: 0.4 mg/dL (ref 0.3–1.2)
Total Protein: 7.6 g/dL (ref 6.5–8.1)

## 2022-05-22 LAB — I-STAT BETA HCG BLOOD, ED (MC, WL, AP ONLY): I-stat hCG, quantitative: <5 m[IU]/mL (ref <5)

## 2022-05-22 LAB — CBC WITH DIFFERENTIAL/PLATELET
Abs Immature Granulocytes: 0 10*3/uL (ref 0.00–0.07)
Basophils Absolute: 0 10*3/uL (ref 0.0–0.1)
Basophils Relative: 0 %
Eosinophils Absolute: 0 10*3/uL (ref 0.0–0.5)
Eosinophils Relative: 1 %
HCT: 44 % (ref 36.0–46.0)
Hemoglobin: 14.7 g/dL (ref 12.0–15.0)
Immature Granulocytes: 0 %
Lymphocytes Relative: 29 %
Lymphs Abs: 1.3 10*3/uL (ref 0.7–4.0)
MCH: 28.1 pg (ref 26.0–34.0)
MCHC: 33.4 g/dL (ref 30.0–36.0)
MCV: 84.1 fL (ref 80.0–100.0)
Monocytes Absolute: 0.5 10*3/uL (ref 0.1–1.0)
Monocytes Relative: 11 %
Neutro Abs: 2.5 10*3/uL (ref 1.7–7.7)
Neutrophils Relative %: 59 %
Platelets: 248 10*3/uL (ref 150–400)
RBC: 5.23 MIL/uL — ABNORMAL HIGH (ref 3.87–5.11)
RDW: 13.9 % (ref 11.5–15.5)
WBC: 4.3 10*3/uL (ref 4.0–10.5)
nRBC: 0 % (ref 0.0–0.2)

## 2022-05-22 LAB — LIPASE, BLOOD: Lipase: 32 U/L (ref 11–51)

## 2022-05-22 MED ORDER — ONDANSETRON 4 MG PO TBDP
4.0000 mg | ORAL_TABLET | Freq: Once | ORAL | Status: AC
  Administered 2022-05-22: 4 mg via ORAL
  Filled 2022-05-22: qty 1

## 2022-05-22 NOTE — ED Provider Triage Note (Signed)
Emergency Medicine Provider Triage Evaluation Note  CHEVON FOMBY , a 32 y.o. female  was evaluated in triage.  Pt complains of vomiting.  She was seen here 3 days ago and tested positive for flu.  She still feels fairly badly due to the flu symptoms, however began vomiting around 9 PM and has not been able to keep anything down.  She has had some upper abdominal tenderness and pain.  No history of abdominal surgeries other than a cosmetic surgery.  Review of Systems  Positive: Vomiting, abdominal pain Negative: Fever  Physical Exam  BP 115/77   Pulse 90   Temp 98.7 F (37.1 C)   Resp 16   LMP 04/22/2022   SpO2 98%  Gen:   Awake, no distress   Resp:  Normal effort  MSK:   Moves extremities without difficulty  Other:  Mild nonfocal upper abdominal pain  Medical Decision Making  Medically screening exam initiated at 1:27 AM.  Appropriate orders placed.  KHARA RENAUD was informed that the remainder of the evaluation will be completed by another provider, this initial triage assessment does not replace that evaluation, and the importance of remaining in the ED until their evaluation is complete.     Carlisle Cater, PA-C 05/22/22 0128

## 2022-05-22 NOTE — ED Triage Notes (Signed)
Patient reports left side abdominal pain with emesis this evening , no diarrhea or fever .

## 2022-05-22 NOTE — ED Notes (Signed)
No answer for vitals recheck

## 2022-06-22 ENCOUNTER — Encounter (HOSPITAL_COMMUNITY): Payer: Self-pay

## 2022-06-22 ENCOUNTER — Other Ambulatory Visit: Payer: Self-pay

## 2022-06-22 ENCOUNTER — Emergency Department (HOSPITAL_COMMUNITY)
Admission: EM | Admit: 2022-06-22 | Discharge: 2022-06-22 | Disposition: A | Discharge status: Home / Self Care | Payer: BC Managed Care – PPO | Attending: Emergency Medicine | Admitting: Emergency Medicine

## 2022-06-22 DIAGNOSIS — R112 Nausea with vomiting, unspecified: Secondary | ICD-10-CM | POA: Insufficient documentation

## 2022-06-22 DIAGNOSIS — R197 Diarrhea, unspecified: Secondary | ICD-10-CM | POA: Insufficient documentation

## 2022-06-22 LAB — CBC WITH DIFFERENTIAL/PLATELET
Abs Immature Granulocytes: 0.02 10*3/uL (ref 0.00–0.07)
Basophils Absolute: 0 10*3/uL (ref 0.0–0.1)
Basophils Relative: 1 %
Eosinophils Absolute: 0 10*3/uL (ref 0.0–0.5)
Eosinophils Relative: 0 %
HCT: 40.6 % (ref 36.0–46.0)
Hemoglobin: 13.6 g/dL (ref 12.0–15.0)
Immature Granulocytes: 0 %
Lymphocytes Relative: 21 %
Lymphs Abs: 1.2 10*3/uL (ref 0.7–4.0)
MCH: 28.2 pg (ref 26.0–34.0)
MCHC: 33.5 g/dL (ref 30.0–36.0)
MCV: 84.1 fL (ref 80.0–100.0)
Monocytes Absolute: 0.2 10*3/uL (ref 0.1–1.0)
Monocytes Relative: 3 %
Neutro Abs: 4.1 10*3/uL (ref 1.7–7.7)
Neutrophils Relative %: 75 %
Platelets: 308 10*3/uL (ref 150–400)
RBC: 4.83 MIL/uL (ref 3.87–5.11)
RDW: 14.2 % (ref 11.5–15.5)
WBC: 5.5 10*3/uL (ref 4.0–10.5)
nRBC: 0 % (ref 0.0–0.2)

## 2022-06-22 LAB — URINALYSIS, ROUTINE W REFLEX MICROSCOPIC
Bacteria, UA: NONE SEEN
Bilirubin Urine: NEGATIVE
Glucose, UA: NEGATIVE mg/dL
Hgb urine dipstick: NEGATIVE
Ketones, ur: 5 mg/dL — AB
Leukocytes,Ua: NEGATIVE
Nitrite: NEGATIVE
Protein, ur: 30 mg/dL — AB
Specific Gravity, Urine: 1.023 (ref 1.005–1.030)
pH: 8 (ref 5.0–8.0)

## 2022-06-22 LAB — I-STAT BETA HCG BLOOD, ED (MC, WL, AP ONLY): I-stat hCG, quantitative: <5 m[IU]/mL (ref <5)

## 2022-06-22 LAB — COMPREHENSIVE METABOLIC PANEL
ALT: 48 U/L — ABNORMAL HIGH (ref 0–44)
AST: 44 U/L — ABNORMAL HIGH (ref 15–41)
Albumin: 4.5 g/dL (ref 3.5–5.0)
Alkaline Phosphatase: 65 U/L (ref 38–126)
Anion gap: 12 (ref 5–15)
BUN: 7 mg/dL (ref 6–20)
CO2: 22 mmol/L (ref 22–32)
Calcium: 9.1 mg/dL (ref 8.9–10.3)
Chloride: 107 mmol/L (ref 98–111)
Creatinine, Ser: 0.74 mg/dL (ref 0.44–1.00)
GFR, Estimated: >60 mL/min (ref >60)
Glucose, Bld: 94 mg/dL (ref 70–99)
Potassium: 3.9 mmol/L (ref 3.5–5.1)
Sodium: 141 mmol/L (ref 135–145)
Total Bilirubin: 0.5 mg/dL (ref 0.3–1.2)
Total Protein: 7.5 g/dL (ref 6.5–8.1)

## 2022-06-22 LAB — LIPASE, BLOOD: Lipase: 29 U/L (ref 11–51)

## 2022-06-22 MED ORDER — KETOROLAC TROMETHAMINE 30 MG/ML IJ SOLN
30.0000 mg | Freq: Once | INTRAMUSCULAR | Status: AC
  Administered 2022-06-22: 30 mg via INTRAVENOUS
  Filled 2022-06-22: qty 1

## 2022-06-22 MED ORDER — ONDANSETRON HCL 4 MG PO TABS: 4.0000 mg | ORAL_TABLET | Freq: Three times a day (TID) | ORAL | 0 refills | Status: DC | PRN

## 2022-06-22 MED ORDER — ONDANSETRON HCL 4 MG/2ML IJ SOLN
4.0000 mg | Freq: Once | INTRAMUSCULAR | Status: AC
  Administered 2022-06-22: 4 mg via INTRAVENOUS
  Filled 2022-06-22: qty 2

## 2022-06-22 MED ORDER — SODIUM CHLORIDE 0.9 % IV BOLUS
1000.0000 mL | Freq: Once | INTRAVENOUS | Status: AC
  Administered 2022-06-22: 1000 mL via INTRAVENOUS

## 2022-06-22 NOTE — ED Notes (Signed)
Patient drank Sprite, with no nausea or vomiting.

## 2022-06-22 NOTE — ED Notes (Signed)
Discharge instructions reviewed with patient. Patient denies any questions or concerns at this time. Patient ambulatory to ED.

## 2022-06-22 NOTE — ED Provider Triage Note (Signed)
Emergency Medicine Provider Triage Evaluation Note  Laurie Reid , a 32 y.o. female  was evaluated in triage.  Pt complains of sudden onset abdominal pain with N/V/D.  States this feels similar to food poisoning in the past.  Denies fevers, chills, urinary symptoms.  Denies blood in the urine or stool or vomit.  LMP 3 weeks ago.  Denies possibility of pregnancy.  Review of Systems  Positive:  Negative: See above  Physical Exam  BP 121/82   Pulse 97   Temp 98.6 F (37 C) (Oral)   Resp 19   Ht 5\' 6"  (1.676 m)   Wt 77.1 kg   SpO2 99%   BMI 27.44 kg/m  Gen:   Awake, no distress   Resp:  Normal effort  MSK:   Moves extremities without difficulty  Other:  Appears mildly uncomfortable.  Not diaphoretic.  Generalized abdominal tenderness, though soft and nondistended.  Medical Decision Making  Medically screening exam initiated at 10:06 AM.  Appropriate orders placed.  Laurie Reid was informed that the remainder of the evaluation will be completed by another provider, this initial triage assessment does not replace that evaluation, and the importance of remaining in the ED until their evaluation is complete.     Prince Rome, PA-C [id number]

## 2022-06-22 NOTE — Discharge Instructions (Signed)
You have been seen and discharged from the emergency department.  Your blood work was normal.  You are most likely suffering from a viral gastroenteritis.  You were given IV medicine.  Stay well-hydrated, take nausea medicine as needed.  Follow-up with your primary provider for further evaluation and further care. Take home medications as prescribed. If you have any worsening symptoms or further concerns for your health please return to an emergency department for further evaluation.

## 2022-06-22 NOTE — ED Triage Notes (Signed)
Pt came in visa POV d/t vomiting & diarrhea the past 5 hrs. States she has not had a fever or body aches, unknown if she has been around anyone sick. A/Ox4, denies pain.

## 2022-06-22 NOTE — ED Provider Notes (Signed)
[facility] Provider Note   CSN: [id number] Arrival date & time: 06/22/22  0945     History  Chief Complaint  Patient presents with   Emesis    DAYLA GASCA is a 32 y.o. female.  HPI   32 year old female presents emergency department with nausea/vomiting/diarrhea for the past 6 hours.  Patient states that she woke up and the symptoms started abruptly.  Denies any blood in the emesis or diarrhea.  No abdominal pain. No fever but endorses chills and myalgias.  She has not been able to keep anything down for the past 6 hours.  Does not believe that she is pregnant.  Home Medications Prior to Admission medications   Medication Sig Start Date End Date Taking? Authorizing Provider  acetaminophen (TYLENOL) 500 MG tablet Take 1,000 mg by mouth every 6 (six) hours as needed for mild pain or headache.     [provider]  dicyclomine (BENTYL) 10 MG capsule Take 1 capsule (10 mg total) by mouth 4 (four) times daily -  before meals and at bedtime. 08/22/20   Volanda Napoleon, PA-C  loperamide (IMODIUM) 2 MG capsule Take 1 capsule (2 mg total) by mouth 4 (four) times daily as needed for diarrhea or loose stools. 08/22/20   Volanda Napoleon, PA-C  omeprazole (PRILOSEC) 20 MG capsule Take 1 capsule (20 mg total) by mouth daily. 11/29/18   Isla Pence, MD  ondansetron (ZOFRAN ODT) 4 MG disintegrating tablet Take 1 tablet (4 mg total) by mouth every 8 (eight) hours as needed for nausea or vomiting. 08/22/20   Volanda Napoleon, PA-C  promethazine (PHENERGAN) 12.5 MG tablet Take 1 tablet (12.5 mg total) by mouth every 6 (six) hours as needed for nausea or vomiting. Patient not taking: Reported on 11/29/2018 09/07/18   Petrucelli, Glynda Jaeger, PA-C  valACYclovir (VALTREX) 1000 MG tablet Take 1 tablet (1,000 mg total) by mouth 2 (two) times daily. Patient not taking: Reported on 09/07/2018 06/18/16   Ashley Murrain, NP      Allergies    Patient has no  known allergies.    Review of Systems   Review of Systems  Constitutional:  Positive for appetite change and fatigue. Negative for fever.  Respiratory:  Negative for shortness of breath.   Cardiovascular:  Negative for chest pain.  Gastrointestinal:  Positive for diarrhea, nausea and vomiting. Negative for abdominal pain.  Musculoskeletal:  Positive for myalgias.  Skin:  Negative for rash.  Neurological:  Negative for headaches.    Physical Exam Updated Vital Signs BP 121/82   Pulse 97   Temp 98.6 F (37 C) (Oral)   Resp 19   Ht 5\' 6"  (1.676 m)   Wt 77.1 kg   SpO2 99%   BMI 27.44 kg/m  Physical Exam Vitals and nursing note reviewed.  Constitutional:      Appearance: Normal appearance.  HENT:     Head: Normocephalic.     Mouth/Throat:     Mouth: Mucous membranes are moist.  Cardiovascular:     Rate and Rhythm: Normal rate.  Pulmonary:     Effort: Pulmonary effort is normal. No respiratory distress.  Abdominal:     General: There is no distension.     Palpations: Abdomen is soft.     Tenderness: There is no abdominal tenderness. There is no guarding.  Skin:    General: Skin is warm.  Neurological:     Mental Status: She is  alert and oriented to person, place, and time. Mental status is at baseline.  Psychiatric:        Mood and Affect: Mood normal.     ED Results / Procedures / Treatments   Labs (all labs ordered are listed, but only abnormal results are displayed) Labs Reviewed  CBC WITH DIFFERENTIAL/PLATELET  COMPREHENSIVE METABOLIC PANEL  LIPASE, BLOOD  URINALYSIS, ROUTINE W REFLEX MICROSCOPIC  I-STAT BETA HCG BLOOD, ED ([facility], [facility], [facility] ONLY)    EKG None  Radiology No results found.  Procedures Procedures    Medications Ordered in ED Medications  sodium chloride 0.9 % bolus 1,000 mL (1,000 mLs Intravenous New Bag/Given 06/22/22 1051)  ondansetron (ZOFRAN) injection 4 mg (4 mg Intravenous Given 06/22/22 1049)    ED Course/ Medical Decision Making/  A&P                             Medical Decision Making Risk Prescription drug management.   32 year old female presents emergency department with nausea/vomiting/diarrhea.  States that other coworkers have been sick with similar symptoms.  Vitals are stable, abdomen is benign.  Blood work shows no acute abnormality.  After IV medicine patient feels significantly improved.  She was able to p.o. challenge without difficulty.  Plan to discharge with symptomatic treatment.  Patient at this time appears safe and stable for discharge and close outpatient follow up. Discharge plan and strict return to ED precautions discussed, patient verbalizes understanding and agreement.        Final Clinical Impression(s) / ED Diagnoses Final diagnoses:  None    Rx / DC Orders ED Discharge Orders     None         Lorelle Gibbs, DO 06/22/22 1346

## 2022-07-19 ENCOUNTER — Other Ambulatory Visit: Payer: Self-pay

## 2022-07-19 ENCOUNTER — Encounter (HOSPITAL_COMMUNITY): Payer: Self-pay | Admitting: Emergency Medicine

## 2022-07-19 ENCOUNTER — Emergency Department (HOSPITAL_COMMUNITY)
Admission: EM | Admit: 2022-07-19 | Discharge: 2022-07-19 | Disposition: A | Discharge status: Home / Self Care | Payer: BC Managed Care – PPO | Attending: Emergency Medicine | Admitting: Emergency Medicine

## 2022-07-19 DIAGNOSIS — R112 Nausea with vomiting, unspecified: Secondary | ICD-10-CM | POA: Diagnosis present

## 2022-07-19 DIAGNOSIS — T50905A Adverse effect of unspecified drugs, medicaments and biological substances, initial encounter: Secondary | ICD-10-CM | POA: Diagnosis not present

## 2022-07-19 LAB — CBC
HCT: 40.7 % (ref 36.0–46.0)
Hemoglobin: 13.8 g/dL (ref 12.0–15.0)
MCH: 28.8 pg (ref 26.0–34.0)
MCHC: 33.9 g/dL (ref 30.0–36.0)
MCV: 85 fL (ref 80.0–100.0)
Platelets: 301 10*3/uL (ref 150–400)
RBC: 4.79 MIL/uL (ref 3.87–5.11)
RDW: 13.9 % (ref 11.5–15.5)
WBC: 9.1 10*3/uL (ref 4.0–10.5)
nRBC: 0 % (ref 0.0–0.2)

## 2022-07-19 LAB — URINALYSIS, ROUTINE W REFLEX MICROSCOPIC
Bilirubin Urine: NEGATIVE
Glucose, UA: NEGATIVE mg/dL
Hgb urine dipstick: NEGATIVE
Ketones, ur: NEGATIVE mg/dL
Leukocytes,Ua: NEGATIVE
Nitrite: NEGATIVE
Protein, ur: NEGATIVE mg/dL
Specific Gravity, Urine: 1.015 (ref 1.005–1.030)
pH: 8 (ref 5.0–8.0)

## 2022-07-19 LAB — I-STAT BETA HCG BLOOD, ED (MC, WL, AP ONLY): I-stat hCG, quantitative: <5 m[IU]/mL (ref <5)

## 2022-07-19 LAB — COMPREHENSIVE METABOLIC PANEL
ALT: 51 U/L — ABNORMAL HIGH (ref 0–44)
AST: 40 U/L (ref 15–41)
Albumin: 4.4 g/dL (ref 3.5–5.0)
Alkaline Phosphatase: 68 U/L (ref 38–126)
Anion gap: 11 (ref 5–15)
BUN: 13 mg/dL (ref 6–20)
CO2: 21 mmol/L — ABNORMAL LOW (ref 22–32)
Calcium: 9.3 mg/dL (ref 8.9–10.3)
Chloride: 104 mmol/L (ref 98–111)
Creatinine, Ser: 0.89 mg/dL (ref 0.44–1.00)
GFR, Estimated: >60 mL/min (ref >60)
Glucose, Bld: 116 mg/dL — ABNORMAL HIGH (ref 70–99)
Potassium: 3.8 mmol/L (ref 3.5–5.1)
Sodium: 136 mmol/L (ref 135–145)
Total Bilirubin: 0.6 mg/dL (ref 0.3–1.2)
Total Protein: 7.2 g/dL (ref 6.5–8.1)

## 2022-07-19 LAB — LIPASE, BLOOD: Lipase: 35 U/L (ref 11–51)

## 2022-07-19 MED ORDER — ONDANSETRON HCL 4 MG/2ML IJ SOLN
4.0000 mg | Freq: Once | INTRAMUSCULAR | Status: AC
  Administered 2022-07-19: 4 mg via INTRAVENOUS
  Filled 2022-07-19: qty 2

## 2022-07-19 MED ORDER — LACTATED RINGERS IV BOLUS
1000.0000 mL | Freq: Once | INTRAVENOUS | Status: AC
  Administered 2022-07-19: 1000 mL via INTRAVENOUS

## 2022-07-19 MED ORDER — ONDANSETRON 4 MG PO TBDP: 4.0000 mg | ORAL_TABLET | Freq: Three times a day (TID) | ORAL | 0 refills | Status: AC | PRN

## 2022-07-19 NOTE — ED Notes (Signed)
Pt ambulated to restroom without incident.

## 2022-07-19 NOTE — Discharge Instructions (Signed)
Your workup looked good.  Avoid alcohol while taking Metronidazole.    Take the zofran for nausea/vomiting.  Next dose at 12:00 noon.

## 2022-07-19 NOTE — ED Notes (Signed)
RN attempted IV 2x without success. IV team consult placed.

## 2022-07-19 NOTE — ED Triage Notes (Signed)
Pt reports her Dr started her on flagyl today, after work she went out w/ some friends and had a drink forgetting about the warnings against it.  Pt started throwing up around 10 pm and can't stop.

## 2022-07-19 NOTE — ED Provider Notes (Signed)
[facility] Emergency Department Provider Note MRN:  [id number]  Arrival date & time: 07/19/22     Chief Complaint   Emesis   History of Present Illness   Laurie Reid is a 32 y.o. year-old female presents to the ED with chief complaint of nausea and vomiting.  Patient states that she has been taking Flagyl and went out drinking with her friends last night.  She states that she forgot not to drink alcohol while taking Flagyl.  She reports having persistent vomiting since drinking.  She denies pain.  Denies any other associated symptoms.  Denies any treatment prior to arrival.  History provided by patient.   Review of Systems  Pertinent positive and negative review of systems noted in HPI.    Physical Exam   Vitals:   07/19/22 0301 07/19/22 0517  BP:  119/77  Pulse: 98 98  Resp: 11 15  Temp:    SpO2: 100% 100%    CONSTITUTIONAL:  non toxic-appearing, NAD NEURO:  Alert and oriented x 3, CN 3-12 grossly intact EYES:  eyes equal and reactive ENT/NECK:  Supple, no stridor  CARDIO:  mildly tachycardic, regular rhythm, appears well-perfused  PULM:  No respiratory distress,  GI/GU:  non-distended,  MSK/SPINE:  No gross deformities, no edema, moves all extremities  SKIN:  no rash, atraumatic   *Additional and/or pertinent findings included in MDM below  Diagnostic and Interventional Summary    EKG Interpretation  Date/Time:    Ventricular Rate:    PR Interval:    QRS Duration:   QT Interval:    QTC Calculation:   R Axis:     Text Interpretation:         Labs Reviewed  COMPREHENSIVE METABOLIC PANEL - Abnormal; Notable for the following components:      Result Value   CO2 21 (*)    Glucose, Bld 116 (*)    ALT 51 (*)    All other components within normal limits  LIPASE, BLOOD  CBC  URINALYSIS, ROUTINE W REFLEX MICROSCOPIC  I-STAT BETA HCG BLOOD, ED ([facility], [facility], [facility] ONLY)    No orders to display    Medications  lactated ringers bolus  1,000 mL (0 mLs Intravenous Stopped 07/19/22 0513)  ondansetron (ZOFRAN) injection 4 mg (4 mg Intravenous Given 07/19/22 0358)  ondansetron (ZOFRAN) injection 4 mg (4 mg Intravenous Given 07/19/22 0510)     Procedures  /  Critical Care Procedures  ED Course and Medical Decision Making  I have reviewed the triage vital signs, the nursing notes, and pertinent available records from the EMR.  Social Determinants Affecting Complexity of Care: Patient has no clinically significant social determinants affecting this chief complaint..   ED Course:    Medical Decision Making Patient here with nausea and vomiting following drinking alcohol while taking Flagyl.  She forgot about the adverse reaction.  Labs ordered in triage are reassuring.  Patient given fluids and Zofran in the ED with significant improvement.    Will discharge home with some Zofran.  I don't think patient requires any further emergent workup or treatment at this time.  Amount and/or Complexity of Data Reviewed Labs: ordered. ECG/medicine tests: ordered.  Risk Prescription drug management.     Consultants: No consultations were needed in caring for this patient.   Treatment and Plan: Emergency department workup does not suggest an emergent condition requiring admission or immediate intervention beyond  what has been performed at this time. The patient is safe  for discharge and has  been instructed to return immediately for worsening symptoms, change in  symptoms or any other concerns    Final Clinical Impressions(s) / ED Diagnoses     ICD-10-CM   1. Adverse effect of drug, initial encounter  [place]       ED Discharge Orders          Ordered    ondansetron (ZOFRAN-ODT) 4 MG disintegrating tablet  Every 8 hours PRN        07/19/22 0552              Discharge Instructions Discussed with and Provided to Patient:    Discharge Instructions      Your workup looked good.  Avoid alcohol while taking  Metronidazole.    Take the zofran for nausea/vomiting.  Next dose at 12:00 noon.         Montine Circle, PA-C 07/19/22 0559    Merrily Pew, MD 07/19/22 [phone]

## 2022-07-19 NOTE — ED Notes (Signed)
Pt ambulated to restroom with steady gait.

## 2022-09-20 ENCOUNTER — Other Ambulatory Visit: Payer: BC Managed Care – PPO

## 2023-06-25 IMAGING — MG MM DIGITAL DIAGNOSTIC UNILAT*R* W/ TOMO W/ CAD
6 series · 6 of 18 positions shown · non-contrast
Comparison: Previous exam(s).

CLINICAL DATA: 30-year-old female presenting as a recall from
baseline screening for possible right breast mass. Patient has a
history of breast cancer in her sister in her 30s.

EXAM:
DIGITAL DIAGNOSTIC UNILATERAL RIGHT MAMMOGRAM WITH TOMOSYNTHESIS AND
CAD; ULTRASOUND RIGHT BREAST LIMITED
TECHNIQUE: Right digital diagnostic mammography and breast tomosynthesis was
performed. The images were evaluated with computer-aided detection.;
Targeted ultrasound examination of the right breast was performed

[R ML synth-2D]
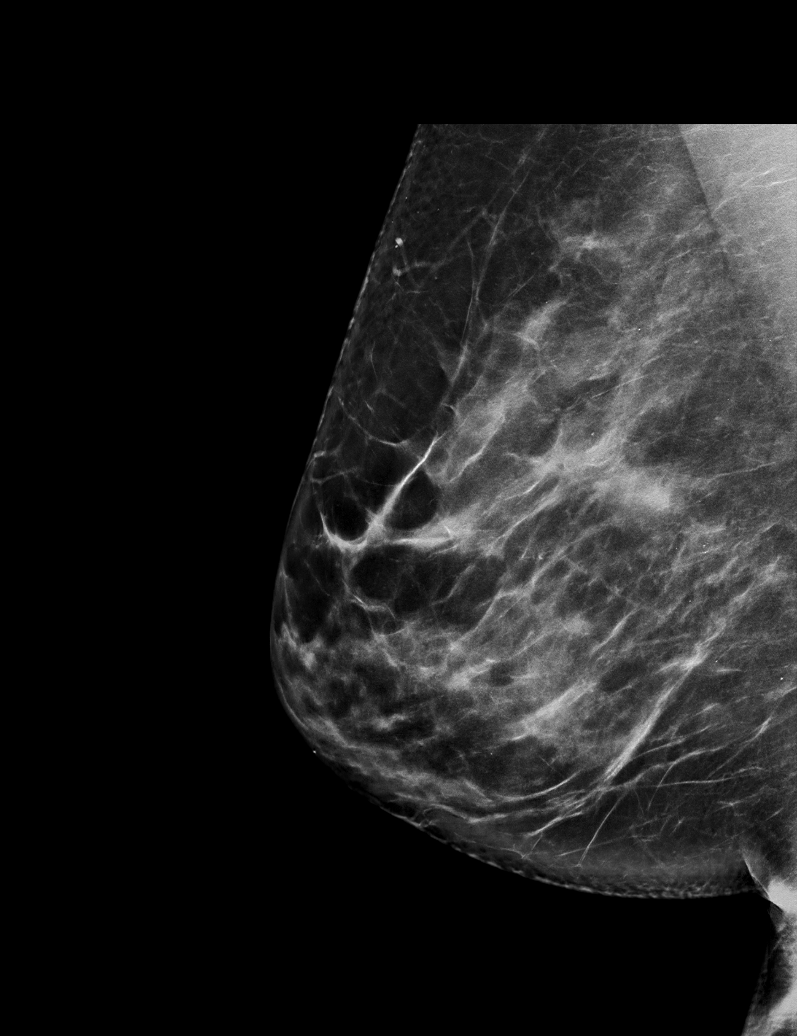

[R MLO synth-2D (1 of 2)]
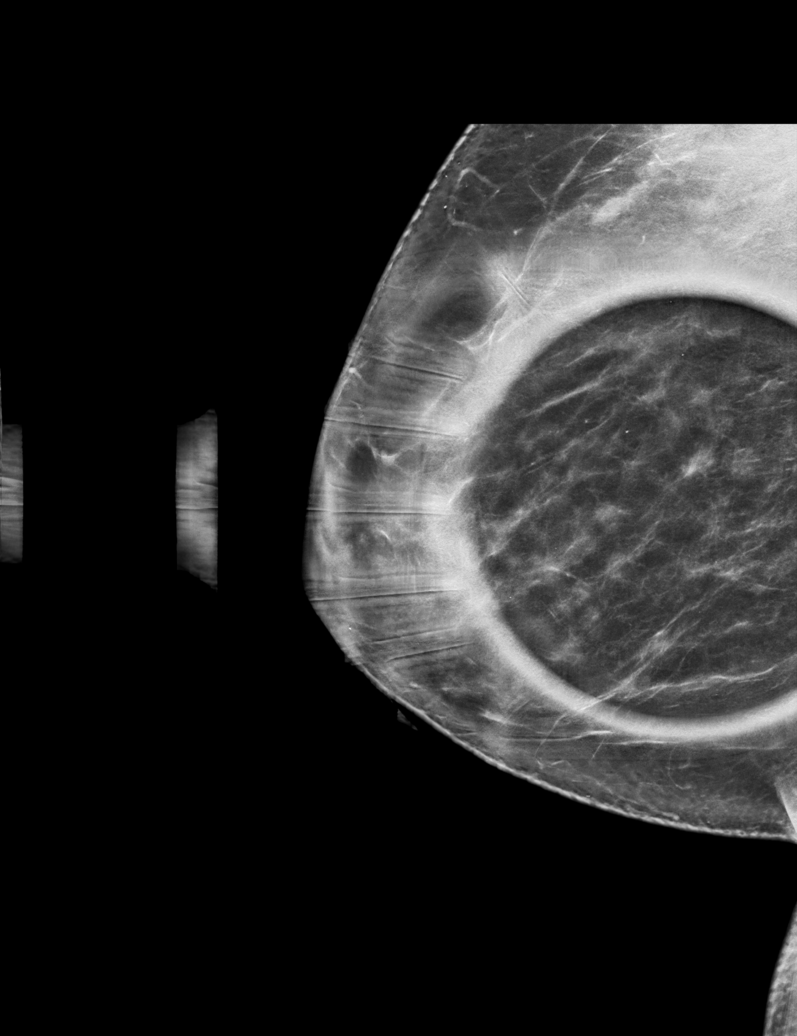

[R MLO synth-2D (2 of 2)]
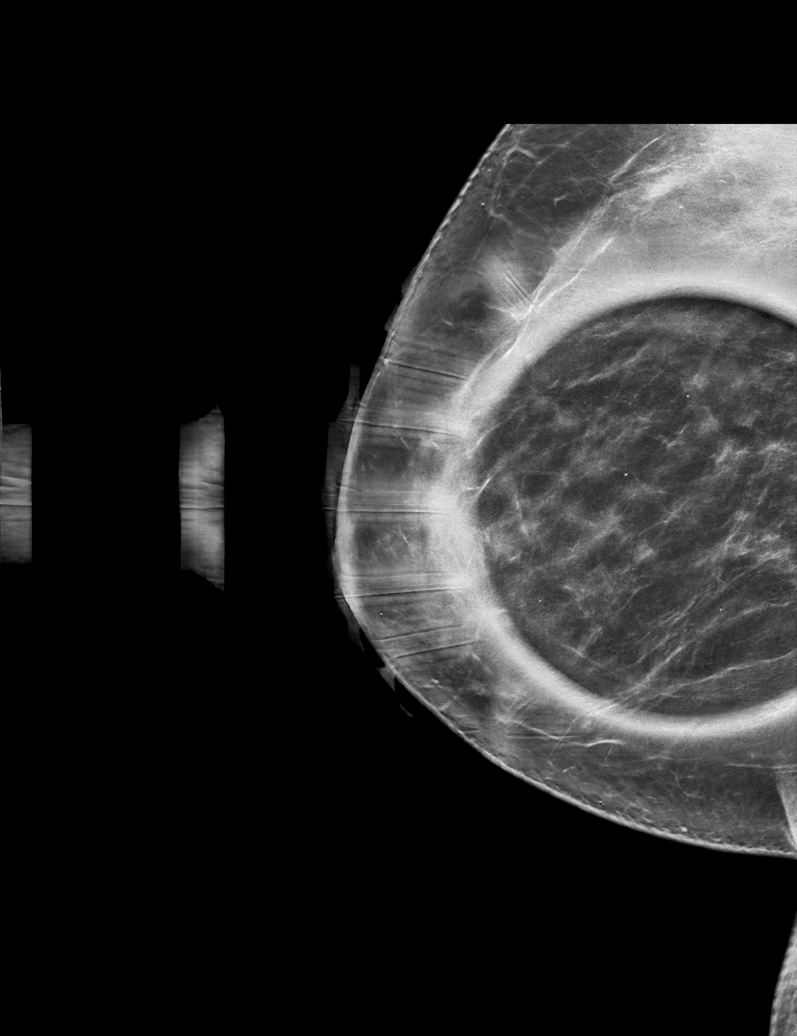

[R ML tomo · tomo slice 45/89.0]
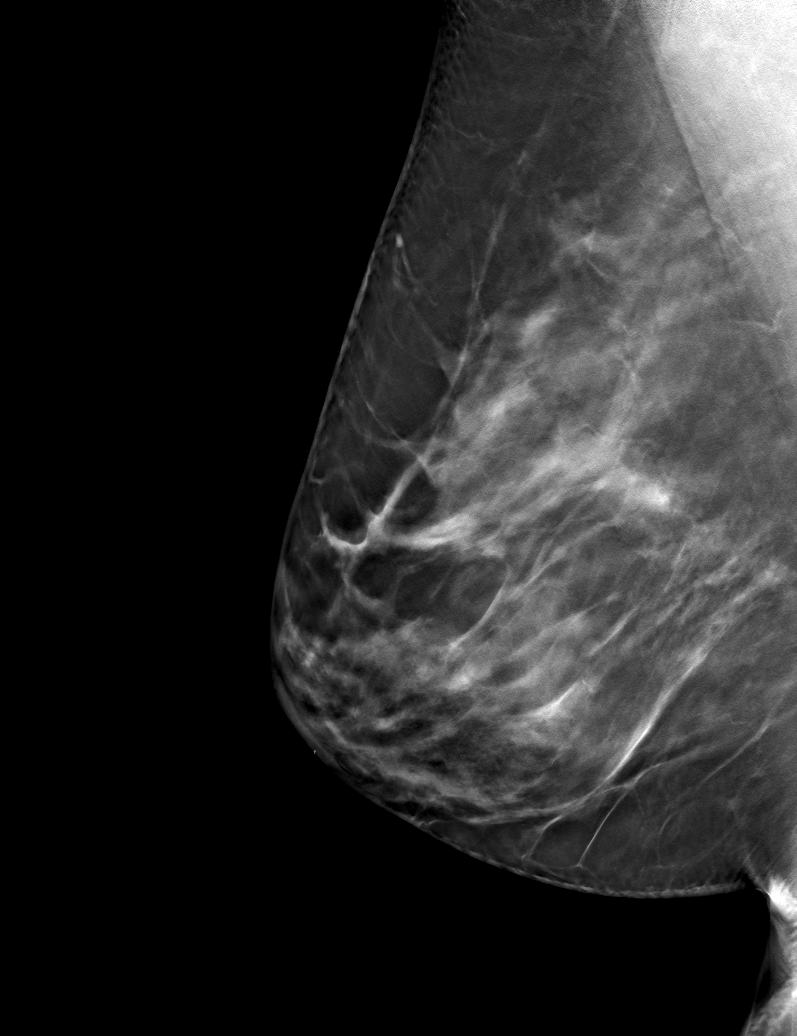

[R MLO tomo (1 of 2) · tomo slice 39/77.0]
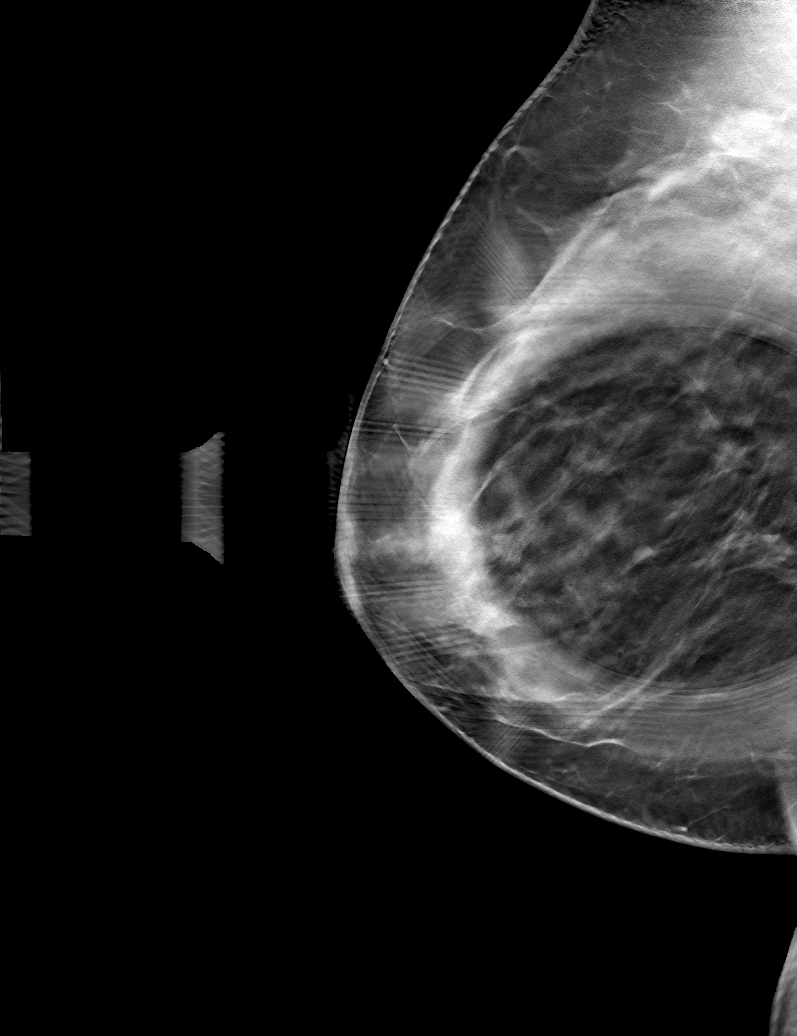

[R MLO tomo (2 of 2) · tomo slice 41/81.0]
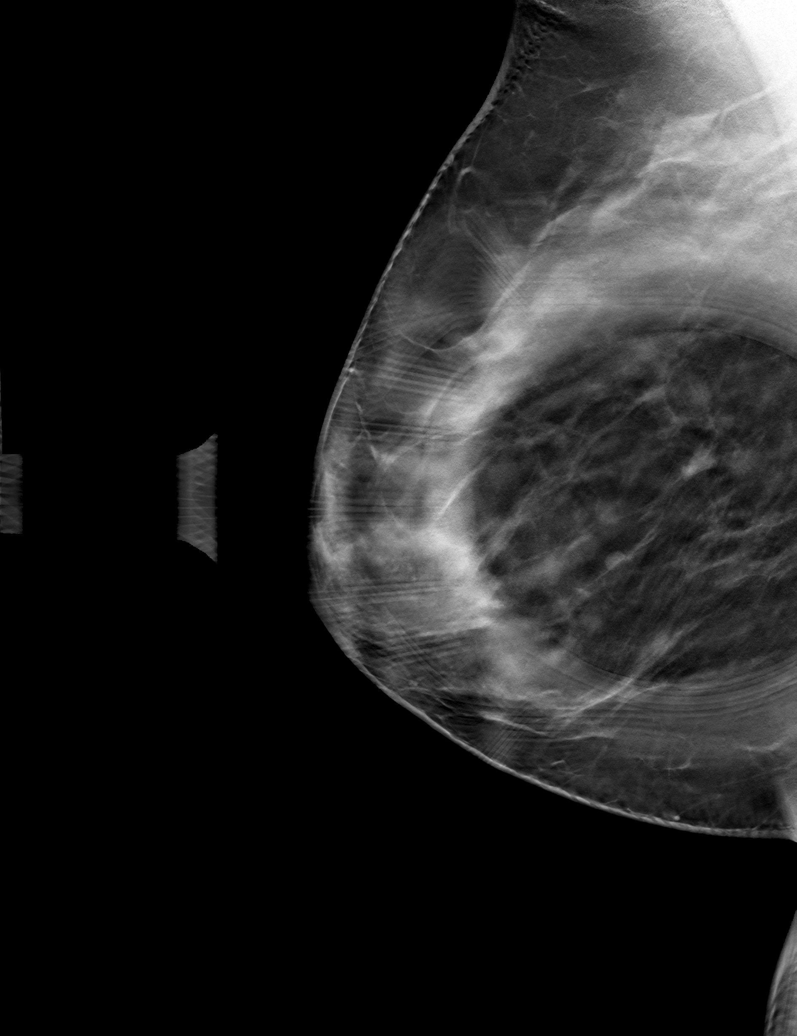

[6 of 18 positions shown; findings below may reference images not displayed]

ACR Breast Density Category c: The breast tissue is heterogeneously
dense, which may obscure small masses.
FINDINGS: Mammogram:

Spot compression tomosynthesis MLO and full field mL tomosynthesis
views of the right breast were performed. There is persistence of an
oval circumscribed mass measuring approximately 0.4 cm in the medial
right breast.

Ultrasound:

Targeted ultrasound performed in the right breast at 3 o'clock 3 cm
from the nipple demonstrating an oval circumscribed hypoechoic mass
measuring 0.4 x 0.2 x 0.2 cm, likely a complicated cyst. No internal
vascularity. This corresponds to the mammographic finding.
IMPRESSION: Probably benign mass in the right breast at 3 o'clock, likely a
complicated cyst.

RECOMMENDATION:
Diagnostic right breast mammogram and possible ultrasound in 6
months.

I have discussed the findings and recommendations with the patient.
If applicable, a reminder letter will be sent to the patient
regarding the next appointment.

BI-RADS CATEGORY  3: Probably benign.

## 2023-06-25 IMAGING — US US BREAST*R* LIMITED INC AXILLA
1 series · 5 of 5 positions shown · non-contrast
Comparison: Previous exam(s).

CLINICAL DATA: 30-year-old female presenting as a recall from
baseline screening for possible right breast mass. Patient has a
history of breast cancer in her sister in her 30s.

EXAM:
DIGITAL DIAGNOSTIC UNILATERAL RIGHT MAMMOGRAM WITH TOMOSYNTHESIS AND
CAD; ULTRASOUND RIGHT BREAST LIMITED
TECHNIQUE: Right digital diagnostic mammography and breast tomosynthesis was
performed. The images were evaluated with computer-aided detection.;
Targeted ultrasound examination of the right breast was performed

[Series 1: us breast*right* limited inc axilla · 0.06mm/px · 5 of 5 slices shown]
[im 1/5]
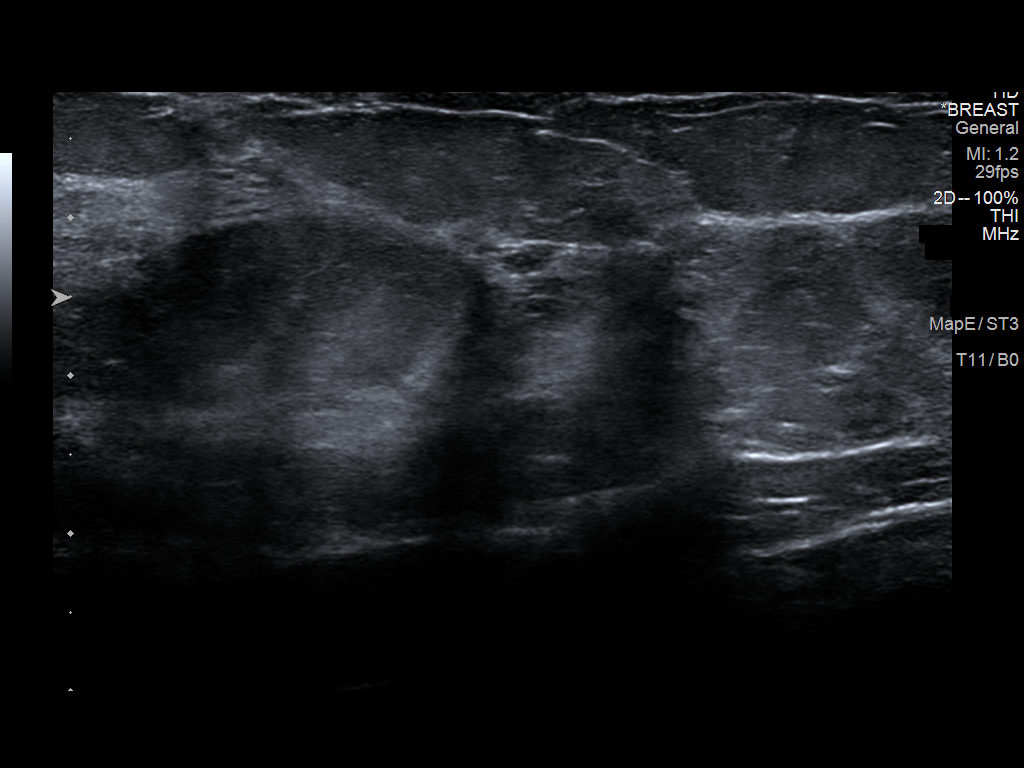
[im 2/5]
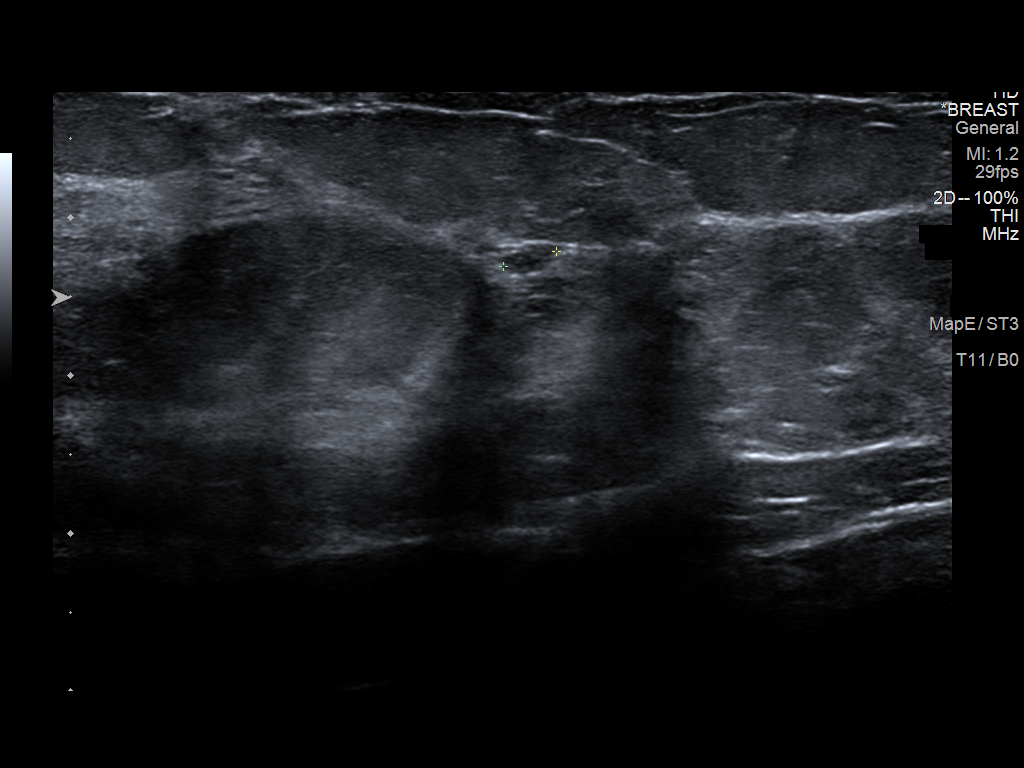
[im 3/5]
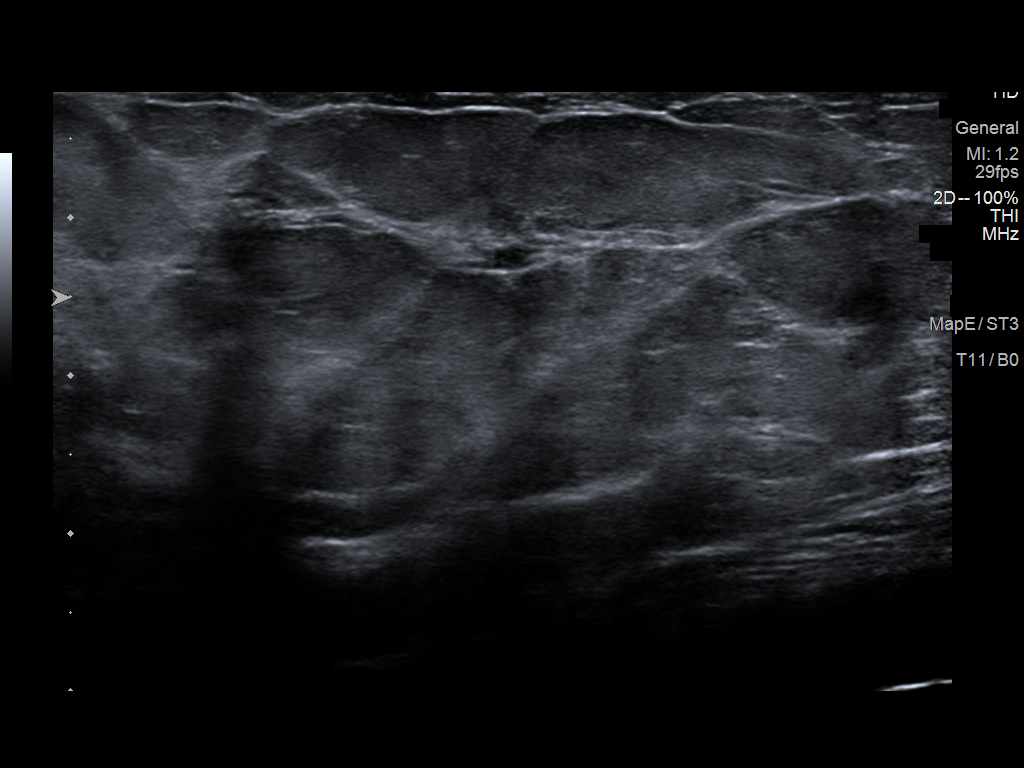
[im 4/5]
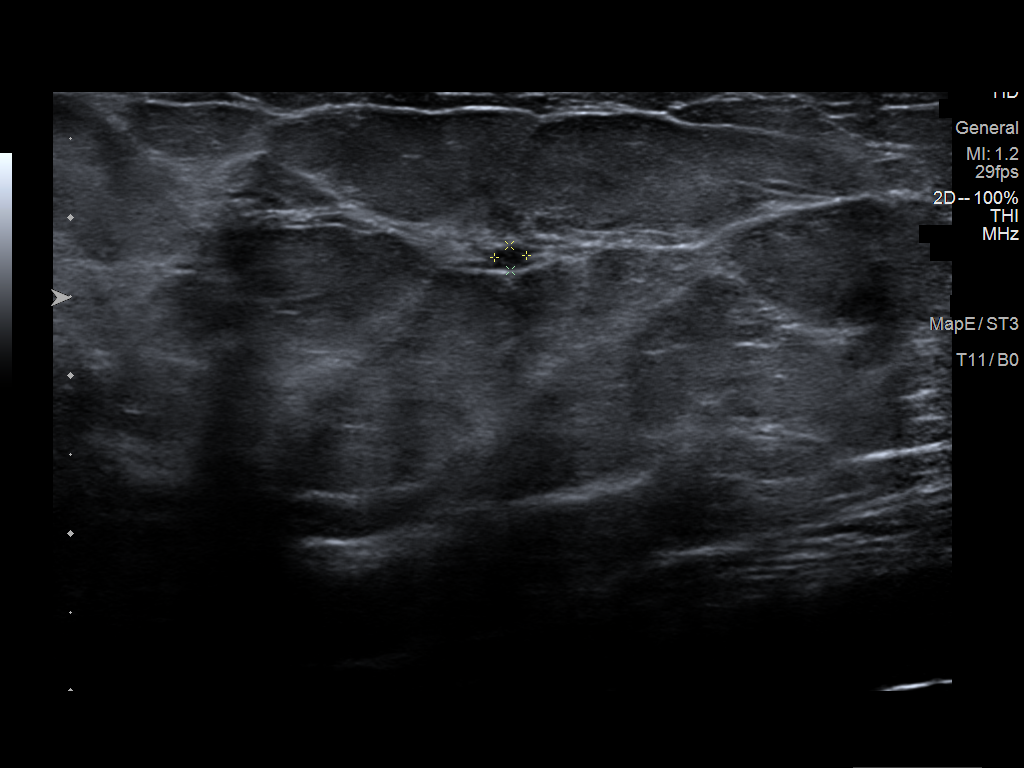
[im 5/5]
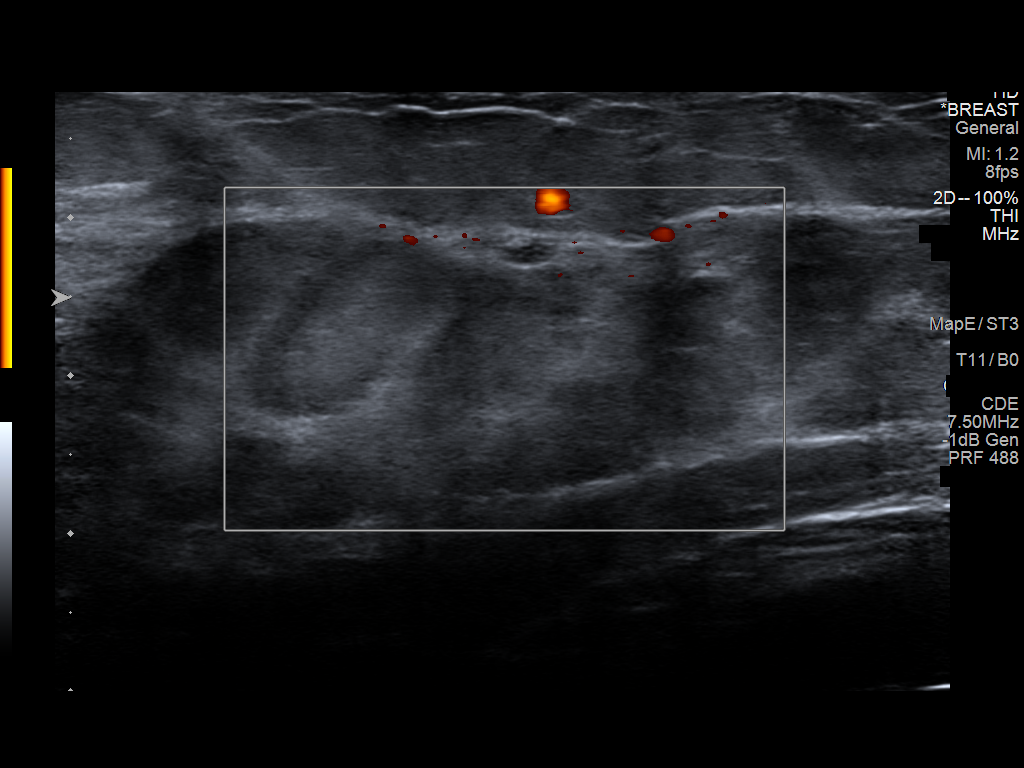

[5 of 5 positions shown; findings below may reference images not displayed]

ACR Breast Density Category c: The breast tissue is heterogeneously
dense, which may obscure small masses.
FINDINGS: Mammogram:

Spot compression tomosynthesis MLO and full field mL tomosynthesis
views of the right breast were performed. There is persistence of an
oval circumscribed mass measuring approximately 0.4 cm in the medial
right breast.

Ultrasound:

Targeted ultrasound performed in the right breast at 3 o'clock 3 cm
from the nipple demonstrating an oval circumscribed hypoechoic mass
measuring 0.4 x 0.2 x 0.2 cm, likely a complicated cyst. No internal
vascularity. This corresponds to the mammographic finding.
IMPRESSION: Probably benign mass in the right breast at 3 o'clock, likely a
complicated cyst.

RECOMMENDATION:
Diagnostic right breast mammogram and possible ultrasound in 6
months.

I have discussed the findings and recommendations with the patient.
If applicable, a reminder letter will be sent to the patient
regarding the next appointment.

BI-RADS CATEGORY  3: Probably benign.
